# Patient Record
Sex: Female | Born: 1965 | Race: White | Hispanic: No | Marital: Married
Health system: Southern US, Community
[De-identification: ages and names within clinical notes are randomized; demographics above are authoritative.]

## PROBLEM LIST (undated history)

## (undated) DIAGNOSIS — E785 Hyperlipidemia, unspecified: Secondary | ICD-10-CM

## (undated) HISTORY — DX: Hyperlipidemia, unspecified: E78.5

---

## 1998-01-28 ENCOUNTER — Other Ambulatory Visit: Admission: RE | Admit: 1998-01-28 | Discharge: 1998-01-28 | Payer: Self-pay | Admitting: *Deleted

## 1999-02-08 ENCOUNTER — Other Ambulatory Visit: Admission: RE | Admit: 1999-02-08 | Discharge: 1999-02-08 | Payer: Self-pay | Admitting: *Deleted

## 1999-04-11 ENCOUNTER — Inpatient Hospital Stay (HOSPITAL_COMMUNITY): Admission: AD | Admit: 1999-04-11 | Discharge: 1999-04-11 | Payer: Self-pay | Admitting: Obstetrics and Gynecology

## 1999-04-21 ENCOUNTER — Encounter: Payer: Self-pay | Admitting: Obstetrics and Gynecology

## 1999-04-22 ENCOUNTER — Encounter (INDEPENDENT_AMBULATORY_CARE_PROVIDER_SITE_OTHER): Payer: Self-pay

## 1999-04-22 ENCOUNTER — Ambulatory Visit (HOSPITAL_COMMUNITY): Admission: AD | Admit: 1999-04-22 | Discharge: 1999-04-22 | Payer: Self-pay | Admitting: *Deleted

## 1999-10-05 ENCOUNTER — Other Ambulatory Visit: Admission: RE | Admit: 1999-10-05 | Discharge: 1999-10-05 | Payer: Self-pay | Admitting: Obstetrics and Gynecology

## 2000-02-08 ENCOUNTER — Inpatient Hospital Stay (HOSPITAL_COMMUNITY): Admission: AD | Admit: 2000-02-08 | Discharge: 2000-02-08 | Payer: Self-pay | Admitting: Obstetrics and Gynecology

## 2000-05-03 ENCOUNTER — Inpatient Hospital Stay (HOSPITAL_COMMUNITY): Admission: AD | Admit: 2000-05-03 | Discharge: 2000-05-05 | Payer: Self-pay | Admitting: Obstetrics and Gynecology

## 2001-02-14 ENCOUNTER — Other Ambulatory Visit: Admission: RE | Admit: 2001-02-14 | Discharge: 2001-02-14 | Payer: Self-pay | Admitting: *Deleted

## 2001-07-10 ENCOUNTER — Other Ambulatory Visit: Admission: RE | Admit: 2001-07-10 | Discharge: 2001-07-10 | Payer: Self-pay | Admitting: Obstetrics and Gynecology

## 2001-08-29 ENCOUNTER — Encounter: Payer: Self-pay | Admitting: Obstetrics and Gynecology

## 2001-08-29 ENCOUNTER — Ambulatory Visit (HOSPITAL_COMMUNITY): Admission: RE | Admit: 2001-08-29 | Discharge: 2001-08-29 | Payer: Self-pay | Admitting: Obstetrics and Gynecology

## 2001-11-15 ENCOUNTER — Inpatient Hospital Stay (HOSPITAL_COMMUNITY): Admission: AD | Admit: 2001-11-15 | Discharge: 2001-11-15 | Payer: Self-pay | Admitting: Obstetrics and Gynecology

## 2002-02-05 ENCOUNTER — Inpatient Hospital Stay (HOSPITAL_COMMUNITY): Admission: AD | Admit: 2002-02-05 | Discharge: 2002-02-07 | Payer: Self-pay | Admitting: Obstetrics and Gynecology

## 2003-01-02 ENCOUNTER — Other Ambulatory Visit: Admission: RE | Admit: 2003-01-02 | Discharge: 2003-01-02 | Payer: Self-pay | Admitting: *Deleted

## 2004-01-05 ENCOUNTER — Other Ambulatory Visit: Admission: RE | Admit: 2004-01-05 | Discharge: 2004-01-05 | Payer: Self-pay | Admitting: *Deleted

## 2004-11-02 ENCOUNTER — Encounter: Admission: RE | Admit: 2004-11-02 | Discharge: 2004-11-02 | Payer: Self-pay | Admitting: *Deleted

## 2005-04-12 ENCOUNTER — Other Ambulatory Visit: Admission: RE | Admit: 2005-04-12 | Discharge: 2005-04-12 | Payer: Self-pay | Admitting: *Deleted

## 2006-06-08 ENCOUNTER — Other Ambulatory Visit: Admission: RE | Admit: 2006-06-08 | Discharge: 2006-06-08 | Payer: Self-pay | Admitting: *Deleted

## 2006-06-12 ENCOUNTER — Encounter: Admission: RE | Admit: 2006-06-12 | Discharge: 2006-06-12 | Payer: Self-pay | Admitting: *Deleted

## 2007-05-30 ENCOUNTER — Ambulatory Visit: Payer: Self-pay | Admitting: Family Medicine

## 2007-06-13 ENCOUNTER — Other Ambulatory Visit: Admission: RE | Admit: 2007-06-13 | Discharge: 2007-06-13 | Payer: Self-pay | Admitting: *Deleted

## 2007-06-15 ENCOUNTER — Encounter: Admission: RE | Admit: 2007-06-15 | Discharge: 2007-06-15 | Payer: Self-pay | Admitting: *Deleted

## 2009-04-23 ENCOUNTER — Encounter: Admission: RE | Admit: 2009-04-23 | Discharge: 2009-04-23 | Payer: Self-pay | Admitting: Obstetrics and Gynecology

## 2010-08-19 ENCOUNTER — Encounter: Admission: RE | Admit: 2010-08-19 | Discharge: 2010-08-19 | Payer: Self-pay | Admitting: Obstetrics and Gynecology
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
## 2011-02-18 NOTE — H&P (Signed)
[facility]  Patient:    [name], [name] Visit Number: [id number] MRN: [id number]          Service Type: OBS Location: 910A [id number] Attending Physician:  Leonard Schwartz Dictated by:   Saverio Danker, C.N.M. Admit Date:  02/05/2002                           History and Physical  HISTORY OF PRESENT ILLNESS:   [name] is a 45 year old married white female, gravida 4, para 1-0-2-1, at 41-2/7 weeks by LMP, confirmed by ultrasound, who presents complaining of leaking clear fluid at 3 a.m. and the onset of uterine contractions every four to five minutes since that time.  She reports that she was 4 cm yesterday and was actually scheduled for induction this morning for being post dates.  She denies any nausea, vomiting, headaches, or visual disturbances.  Her pregnancy has been followed at [facility] by the certified nurse midwife service and has been at risk for:  (1) Advanced maternal age; (2) declining amnio; (3) Rh negative; (4) Positive group B strep; (5) history of prolonged second stage with her previous delivery.  OB/GYN HISTORY:               She is a gravida 4, para 1-0-2-1 who had elective AB in 1996 with no complications, a miscarriage in 2000 requiring a D&E, and a viable female infant delivered in July 2001 who weighed 8 pounds 5 ounces at [redacted] weeks gestation.  She was delivered vaginally with an epidural for anesthesia.  His name is Sherilyn Cooter.  She is Rh negative and did receive RhoGAM with that pregnancy.  She was also group B strep positive with that pregnancy as well as this one.  PAST MEDICAL HISTORY:         She reports having had the usual childhood diseases.  She has no medical problems.  PAST SURGICAL HISTORY:        Her only surgery was wisdom teeth removed.  ALLERGIES:                    No known drug allergies.  FAMILY HISTORY:               Significant only for maternal grandfather with heart  disease, maternal grandmother with diabetes, and paternal grandfather with prostate cancer.  GENETIC HISTORY:              Negative with the exception that the patient is over age 45 at delivery and declined amnio.  SOCIAL HISTORY:               She is married to [facility], who is involved and supportive.  They are of the [place] faith.  They deny any illicit drug use, alcohol, or smoking with this pregnancy.  PRENATAL LABORATORY DATA:     Her blood type is O negative, her antibody screen was negative.  Syphilis is nonreactive.  Rubella is equivocal. Hepatitis B surface antigen is negative.  GC and Chlamydia are both negative. Pap is within normal limits.  Her one-hour Glucola is 107, and her maternal serum alpha-fetoprotein was within the normal range.  A 36-week beta strep was positive.  PHYSICAL EXAMINATION:  VITAL SIGNS:                  Stable.  She is afebrile.  HEENT:  Grossly within normal limits.  CHEST:                        Clear.  BREASTS:                      Soft and nontender.  CARDIAC:                      Her heart is regular rhythm and rate.  ABDOMEN:                      Gravid with uterine contractions every four to five minutes.  Her fetal heart rate is reactive and reassuring.  PELVIC:                       5-6 cm, 90%, vertex -1 to 0, with spontaneous rupture of membranes and clear fluid noted.  EXTREMITIES:                  Within normal limits.  ASSESSMENT:                   1. Intrauterine pregnancy at term.                               2. Spontaneous rupture of membranes at 3 a.m.                               3. Active labor.                               4. Positive group B Streptococcus.  PLAN:                         1. Admit to labor and delivery.                               2. Follow routine CNM orders.                               3. Will give her penicillin for group B strep                                   prophylaxis.                               4. Notify Dr. Janine Limbo of patients                                  admission.  NEUROLOGIC: Dictated by:   Vance Gather Duplantis, C.N.M. Attending Physician:  Leonard Schwartz DD:  02/05/02 TD:  02/05/02 Job: [id number]

## 2012-10-23 IMAGING — MG MM DIGITAL SCREENING
4 series · 4 of 4 positions shown · non-contrast
Comparison: Prior studies.

DG SCREEN MAMMOGRAM BILATERAL
Bilateral CC and MLO view(s) were taken.

DIGITAL SCREENING MAMMOGRAM WITH CAD:

[R CC]
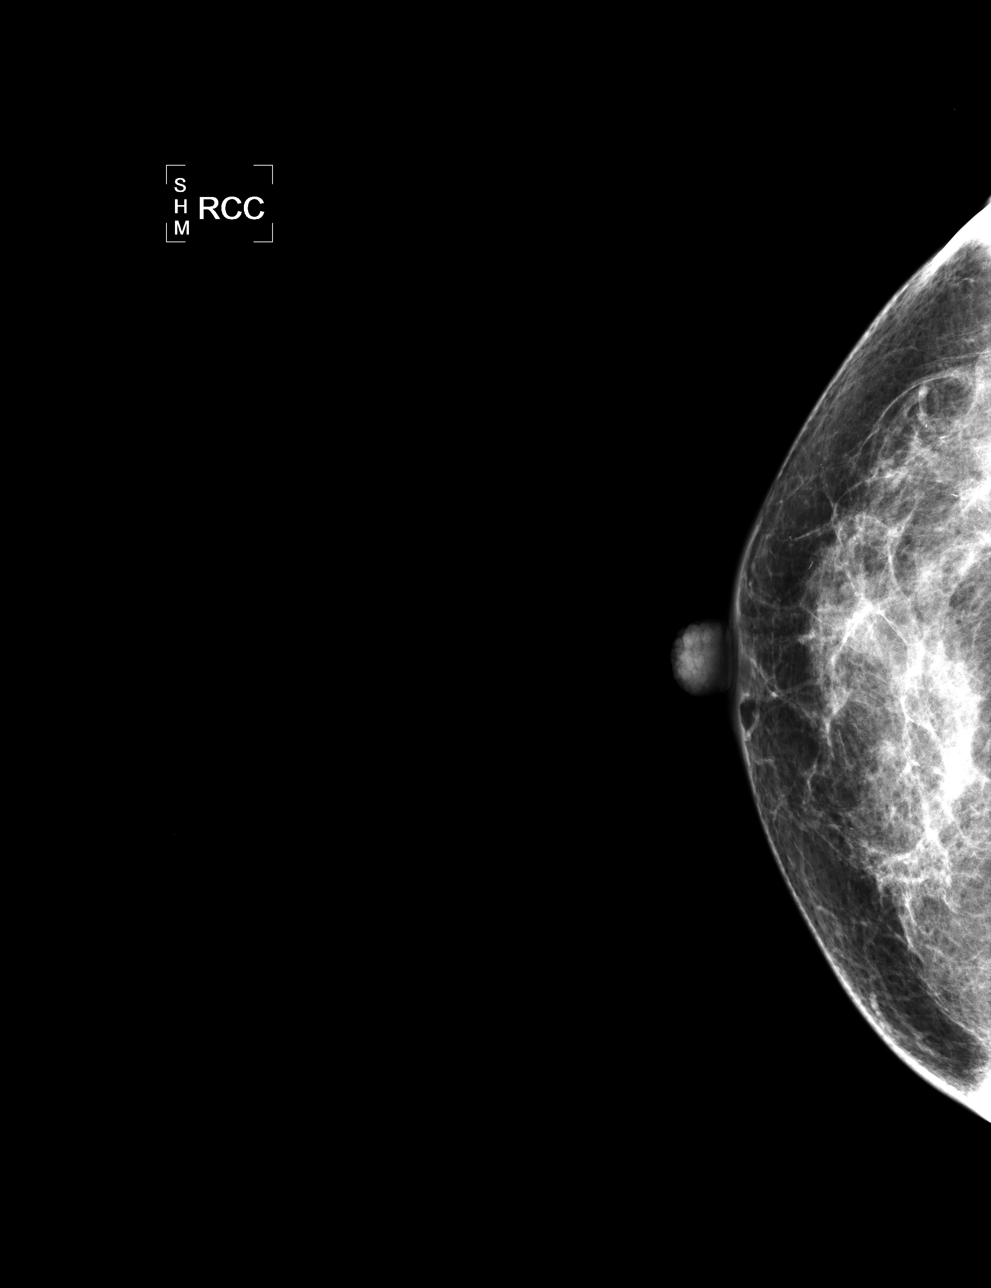

[L CC]
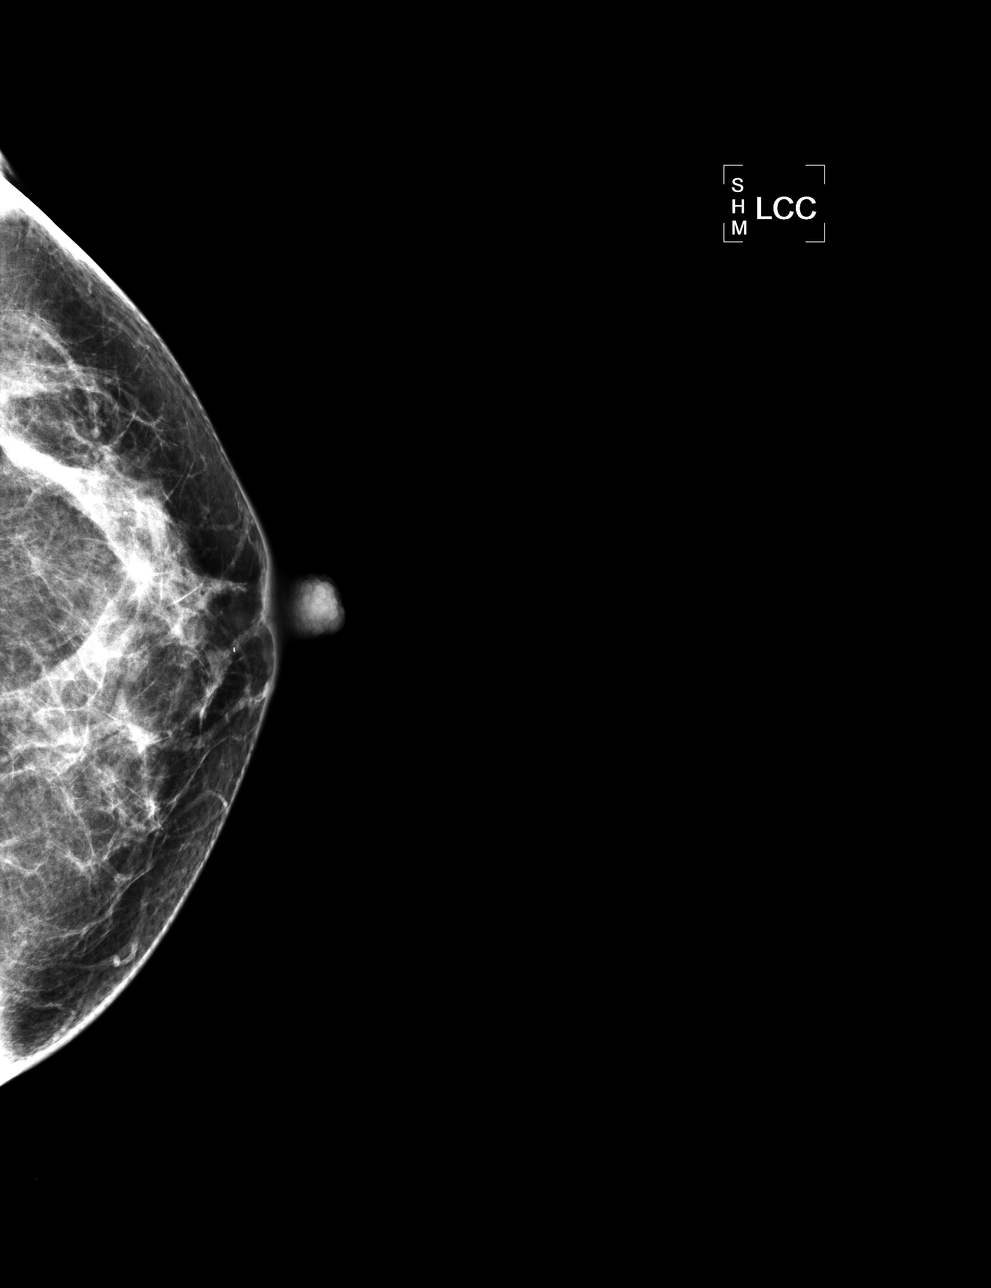

[L MLO]
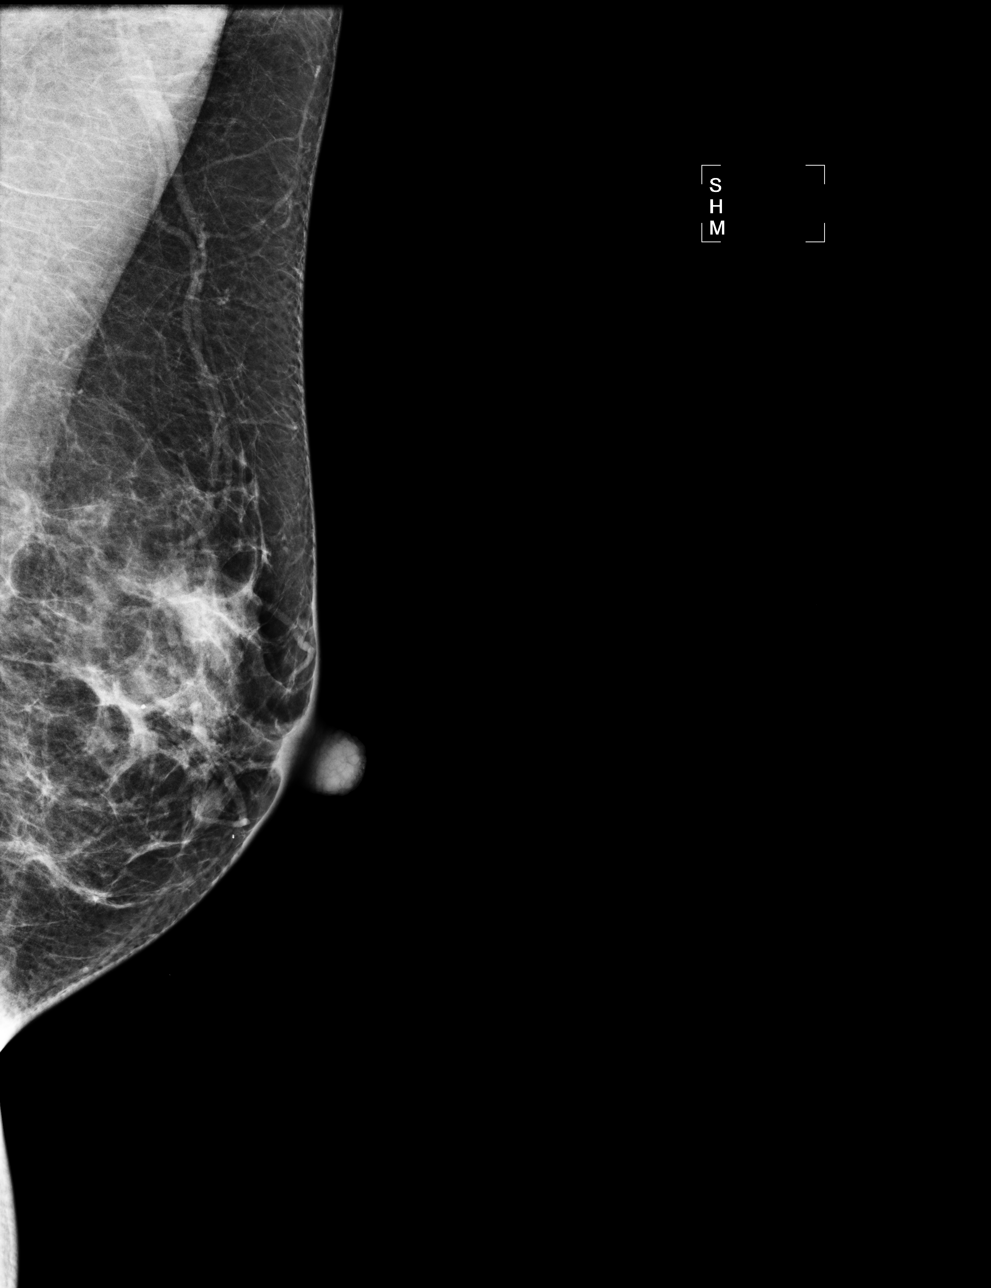

[R MLO]
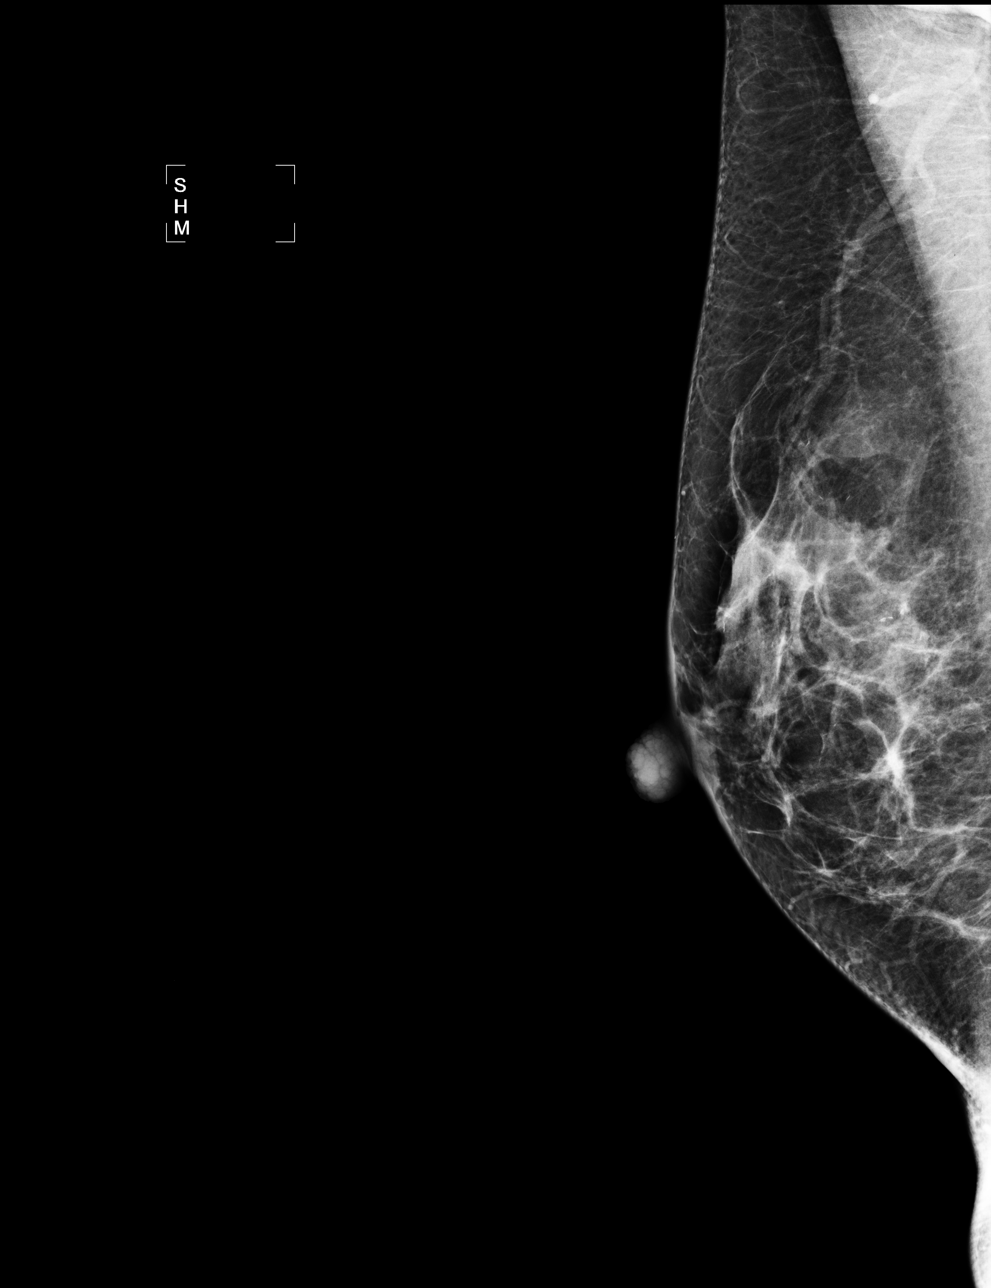

[4 of 4 positions shown; findings below may reference images not displayed]

There are scattered fibroglandular densities.  There is no dominant mass, architectural distortion 
or calcification to suggest malignancy.

Images were processed with CAD.
IMPRESSION: No mammographic evidence of malignancy.  Suggest yearly screening mammography.

A result letter of this screening mammogram will be mailed directly to the patient.

ASSESSMENT: Negative - BI-RADS 1

Screening mammogram in 1 year.
,

## 2016-06-01 DIAGNOSIS — R2231 Localized swelling, mass and lump, right upper limb: Secondary | ICD-10-CM | POA: Diagnosis not present

## 2016-06-30 DIAGNOSIS — E559 Vitamin D deficiency, unspecified: Secondary | ICD-10-CM | POA: Diagnosis not present

## 2016-06-30 DIAGNOSIS — Z0000 Encounter for general adult medical examination without abnormal findings: Secondary | ICD-10-CM | POA: Diagnosis not present

## 2016-06-30 DIAGNOSIS — E785 Hyperlipidemia, unspecified: Secondary | ICD-10-CM | POA: Diagnosis not present

## 2016-08-17 DIAGNOSIS — Z6820 Body mass index (BMI) 20.0-20.9, adult: Secondary | ICD-10-CM | POA: Diagnosis not present

## 2016-08-17 DIAGNOSIS — Z1231 Encounter for screening mammogram for malignant neoplasm of breast: Secondary | ICD-10-CM | POA: Diagnosis not present

## 2016-08-17 DIAGNOSIS — Z01419 Encounter for gynecological examination (general) (routine) without abnormal findings: Secondary | ICD-10-CM | POA: Diagnosis not present

## 2016-11-29 DIAGNOSIS — Z8371 Family history of colonic polyps: Secondary | ICD-10-CM | POA: Diagnosis not present

## 2016-11-29 DIAGNOSIS — Z1211 Encounter for screening for malignant neoplasm of colon: Secondary | ICD-10-CM | POA: Diagnosis not present

## 2016-11-29 DIAGNOSIS — D126 Benign neoplasm of colon, unspecified: Secondary | ICD-10-CM | POA: Diagnosis not present

## 2016-12-01 DIAGNOSIS — D126 Benign neoplasm of colon, unspecified: Secondary | ICD-10-CM | POA: Diagnosis not present

## 2016-12-27 DIAGNOSIS — E559 Vitamin D deficiency, unspecified: Secondary | ICD-10-CM | POA: Diagnosis not present

## 2016-12-27 DIAGNOSIS — E785 Hyperlipidemia, unspecified: Secondary | ICD-10-CM | POA: Diagnosis not present

## 2017-03-29 DIAGNOSIS — M19011 Primary osteoarthritis, right shoulder: Secondary | ICD-10-CM | POA: Diagnosis not present

## 2017-03-29 DIAGNOSIS — G8929 Other chronic pain: Secondary | ICD-10-CM | POA: Diagnosis not present

## 2017-03-29 DIAGNOSIS — M25511 Pain in right shoulder: Secondary | ICD-10-CM | POA: Diagnosis not present

## 2017-03-29 DIAGNOSIS — M7541 Impingement syndrome of right shoulder: Secondary | ICD-10-CM | POA: Diagnosis not present

## 2017-03-30 DIAGNOSIS — M7541 Impingement syndrome of right shoulder: Secondary | ICD-10-CM | POA: Diagnosis not present

## 2017-03-30 DIAGNOSIS — G8929 Other chronic pain: Secondary | ICD-10-CM | POA: Diagnosis not present

## 2017-04-19 DIAGNOSIS — M25511 Pain in right shoulder: Secondary | ICD-10-CM | POA: Diagnosis not present

## 2017-04-26 DIAGNOSIS — M25511 Pain in right shoulder: Secondary | ICD-10-CM | POA: Diagnosis not present

## 2017-05-05 DIAGNOSIS — M25511 Pain in right shoulder: Secondary | ICD-10-CM | POA: Diagnosis not present

## 2017-05-13 DIAGNOSIS — S8265XA Nondisplaced fracture of lateral malleolus of left fibula, initial encounter for closed fracture: Secondary | ICD-10-CM | POA: Diagnosis not present

## 2017-05-15 DIAGNOSIS — M25572 Pain in left ankle and joints of left foot: Secondary | ICD-10-CM | POA: Diagnosis not present

## 2017-05-16 DIAGNOSIS — M25572 Pain in left ankle and joints of left foot: Secondary | ICD-10-CM | POA: Diagnosis not present

## 2017-05-26 DIAGNOSIS — M25572 Pain in left ankle and joints of left foot: Secondary | ICD-10-CM | POA: Diagnosis not present

## 2017-05-26 DIAGNOSIS — S82832A Other fracture of upper and lower end of left fibula, initial encounter for closed fracture: Secondary | ICD-10-CM | POA: Diagnosis not present

## 2017-06-15 DIAGNOSIS — M25572 Pain in left ankle and joints of left foot: Secondary | ICD-10-CM | POA: Diagnosis not present

## 2017-06-15 DIAGNOSIS — S82832D Other fracture of upper and lower end of left fibula, subsequent encounter for closed fracture with routine healing: Secondary | ICD-10-CM | POA: Diagnosis not present

## 2017-06-27 DIAGNOSIS — M25511 Pain in right shoulder: Secondary | ICD-10-CM | POA: Diagnosis not present

## 2017-06-30 DIAGNOSIS — M25511 Pain in right shoulder: Secondary | ICD-10-CM | POA: Diagnosis not present

## 2017-07-07 DIAGNOSIS — S82492E Other fracture of shaft of left fibula, subsequent encounter for open fracture type I or II with routine healing: Secondary | ICD-10-CM | POA: Diagnosis not present

## 2017-07-07 DIAGNOSIS — M25511 Pain in right shoulder: Secondary | ICD-10-CM | POA: Diagnosis not present

## 2017-07-14 DIAGNOSIS — M25511 Pain in right shoulder: Secondary | ICD-10-CM | POA: Diagnosis not present

## 2017-07-14 DIAGNOSIS — S82492E Other fracture of shaft of left fibula, subsequent encounter for open fracture type I or II with routine healing: Secondary | ICD-10-CM | POA: Diagnosis not present

## 2017-07-31 DIAGNOSIS — M25511 Pain in right shoulder: Secondary | ICD-10-CM | POA: Diagnosis not present

## 2017-07-31 DIAGNOSIS — S82492E Other fracture of shaft of left fibula, subsequent encounter for open fracture type I or II with routine healing: Secondary | ICD-10-CM | POA: Diagnosis not present

## 2017-08-02 DIAGNOSIS — E785 Hyperlipidemia, unspecified: Secondary | ICD-10-CM | POA: Diagnosis not present

## 2017-08-02 DIAGNOSIS — Z0000 Encounter for general adult medical examination without abnormal findings: Secondary | ICD-10-CM | POA: Diagnosis not present

## 2017-08-02 DIAGNOSIS — E559 Vitamin D deficiency, unspecified: Secondary | ICD-10-CM | POA: Diagnosis not present

## 2017-08-09 DIAGNOSIS — M25511 Pain in right shoulder: Secondary | ICD-10-CM | POA: Diagnosis not present

## 2017-08-09 DIAGNOSIS — S82492E Other fracture of shaft of left fibula, subsequent encounter for open fracture type I or II with routine healing: Secondary | ICD-10-CM | POA: Diagnosis not present

## 2017-08-15 DIAGNOSIS — M25511 Pain in right shoulder: Secondary | ICD-10-CM | POA: Diagnosis not present

## 2017-08-15 DIAGNOSIS — S82492E Other fracture of shaft of left fibula, subsequent encounter for open fracture type I or II with routine healing: Secondary | ICD-10-CM | POA: Diagnosis not present

## 2017-08-30 DIAGNOSIS — S82492E Other fracture of shaft of left fibula, subsequent encounter for open fracture type I or II with routine healing: Secondary | ICD-10-CM | POA: Diagnosis not present

## 2017-08-30 DIAGNOSIS — M25511 Pain in right shoulder: Secondary | ICD-10-CM | POA: Diagnosis not present

## 2017-10-05 DIAGNOSIS — Z01419 Encounter for gynecological examination (general) (routine) without abnormal findings: Secondary | ICD-10-CM | POA: Diagnosis not present

## 2017-10-05 DIAGNOSIS — Z1231 Encounter for screening mammogram for malignant neoplasm of breast: Secondary | ICD-10-CM | POA: Diagnosis not present

## 2017-10-05 DIAGNOSIS — Z6822 Body mass index (BMI) 22.0-22.9, adult: Secondary | ICD-10-CM | POA: Diagnosis not present

## 2017-11-03 DIAGNOSIS — E7800 Pure hypercholesterolemia, unspecified: Secondary | ICD-10-CM | POA: Diagnosis not present

## 2017-12-07 DIAGNOSIS — S82492E Other fracture of shaft of left fibula, subsequent encounter for open fracture type I or II with routine healing: Secondary | ICD-10-CM | POA: Diagnosis not present

## 2017-12-07 DIAGNOSIS — M25511 Pain in right shoulder: Secondary | ICD-10-CM | POA: Diagnosis not present

## 2017-12-21 DIAGNOSIS — N950 Postmenopausal bleeding: Secondary | ICD-10-CM | POA: Diagnosis not present

## 2017-12-28 DIAGNOSIS — N950 Postmenopausal bleeding: Secondary | ICD-10-CM | POA: Diagnosis not present

## 2018-01-05 DIAGNOSIS — M25511 Pain in right shoulder: Secondary | ICD-10-CM | POA: Diagnosis not present

## 2018-01-05 DIAGNOSIS — S82492E Other fracture of shaft of left fibula, subsequent encounter for open fracture type I or II with routine healing: Secondary | ICD-10-CM | POA: Diagnosis not present

## 2018-01-25 DIAGNOSIS — M25511 Pain in right shoulder: Secondary | ICD-10-CM | POA: Diagnosis not present

## 2018-01-25 DIAGNOSIS — S82492E Other fracture of shaft of left fibula, subsequent encounter for open fracture type I or II with routine healing: Secondary | ICD-10-CM | POA: Diagnosis not present

## 2018-02-27 DIAGNOSIS — M25511 Pain in right shoulder: Secondary | ICD-10-CM | POA: Diagnosis not present

## 2018-02-27 DIAGNOSIS — S82492E Other fracture of shaft of left fibula, subsequent encounter for open fracture type I or II with routine healing: Secondary | ICD-10-CM | POA: Diagnosis not present

## 2018-02-28 DIAGNOSIS — I788 Other diseases of capillaries: Secondary | ICD-10-CM | POA: Diagnosis not present

## 2018-02-28 DIAGNOSIS — L814 Other melanin hyperpigmentation: Secondary | ICD-10-CM | POA: Diagnosis not present

## 2018-02-28 DIAGNOSIS — B351 Tinea unguium: Secondary | ICD-10-CM | POA: Diagnosis not present

## 2018-04-04 DIAGNOSIS — M25511 Pain in right shoulder: Secondary | ICD-10-CM | POA: Diagnosis not present

## 2018-04-04 DIAGNOSIS — S82492E Other fracture of shaft of left fibula, subsequent encounter for open fracture type I or II with routine healing: Secondary | ICD-10-CM | POA: Diagnosis not present

## 2018-04-13 DIAGNOSIS — M25511 Pain in right shoulder: Secondary | ICD-10-CM | POA: Diagnosis not present

## 2018-04-13 DIAGNOSIS — S82492E Other fracture of shaft of left fibula, subsequent encounter for open fracture type I or II with routine healing: Secondary | ICD-10-CM | POA: Diagnosis not present

## 2018-05-17 DIAGNOSIS — S82492E Other fracture of shaft of left fibula, subsequent encounter for open fracture type I or II with routine healing: Secondary | ICD-10-CM | POA: Diagnosis not present

## 2018-05-17 DIAGNOSIS — M25511 Pain in right shoulder: Secondary | ICD-10-CM | POA: Diagnosis not present

## 2018-05-25 DIAGNOSIS — S82492E Other fracture of shaft of left fibula, subsequent encounter for open fracture type I or II with routine healing: Secondary | ICD-10-CM | POA: Diagnosis not present

## 2018-05-25 DIAGNOSIS — M25511 Pain in right shoulder: Secondary | ICD-10-CM | POA: Diagnosis not present

## 2018-06-14 DIAGNOSIS — S82492E Other fracture of shaft of left fibula, subsequent encounter for open fracture type I or II with routine healing: Secondary | ICD-10-CM | POA: Diagnosis not present

## 2018-06-14 DIAGNOSIS — M25511 Pain in right shoulder: Secondary | ICD-10-CM | POA: Diagnosis not present

## 2018-06-21 DIAGNOSIS — S82492E Other fracture of shaft of left fibula, subsequent encounter for open fracture type I or II with routine healing: Secondary | ICD-10-CM | POA: Diagnosis not present

## 2018-06-21 DIAGNOSIS — M25511 Pain in right shoulder: Secondary | ICD-10-CM | POA: Diagnosis not present

## 2018-07-05 DIAGNOSIS — M25511 Pain in right shoulder: Secondary | ICD-10-CM | POA: Diagnosis not present

## 2018-07-05 DIAGNOSIS — S82492E Other fracture of shaft of left fibula, subsequent encounter for open fracture type I or II with routine healing: Secondary | ICD-10-CM | POA: Diagnosis not present

## 2018-08-29 DIAGNOSIS — E785 Hyperlipidemia, unspecified: Secondary | ICD-10-CM | POA: Diagnosis not present

## 2018-08-29 DIAGNOSIS — E559 Vitamin D deficiency, unspecified: Secondary | ICD-10-CM | POA: Diagnosis not present

## 2018-08-29 DIAGNOSIS — Z0000 Encounter for general adult medical examination without abnormal findings: Secondary | ICD-10-CM | POA: Diagnosis not present

## 2018-09-20 ENCOUNTER — Encounter: Payer: Self-pay | Attending: Family Medicine | Admitting: Registered"

## 2018-09-20 ENCOUNTER — Encounter: Payer: Self-pay | Admitting: Registered"

## 2018-09-20 DIAGNOSIS — E785 Hyperlipidemia, unspecified: Secondary | ICD-10-CM | POA: Insufficient documentation

## 2018-09-20 NOTE — Patient Instructions (Signed)
Instructions/Goals:    Make sure to get in three meals per day. Try to have balanced meals like the My Plate example (see handout). Include lean proteins, vegetables, fruits, and whole grains at meals.    Include heart healthy unsaturated fats and limit saturated fats. On nutrition label-limit items with 20% saturated fat and select those with around 5% saturated fat or less more often.    Include heart healthy fish including salmon, mackerel, albacore tuna, sardines, and herring.   Continue with calcium and vitamin D supplements.   Continue including at least 150 minutes of physical activity each week.   Water Goal: 64 oz per day.

## 2018-09-20 NOTE — Progress Notes (Signed)
Medical Nutrition Therapy:  Appt start time: 0807 end time:  0850.  Assessment:  Primary concerns today: Pt referred for hyperlipidemia. Pt reports that she has not been able to keep her cholesterol down consistently. Pt reports that she made many dietary changes (cut out most sweets and greatly reduced bread intake) and lost 10 lb. Pt reports she saw improvement in her cholesterol level previously following those changes. However, her last lab work on 08/29/18 showed elevated LDL (174) and NHDL (187). She reports that high cholesterol does run in her family (father and paternal grandmother). Pt reports that she knows how to eat for heart health and feels she is eating healthy. Pt reports she would like suggestions on changes she should make to her diet, foods to incorporate, etc and wants to know if she should keep a food journal. Pt reports several food dislikes and some texture sensitivities. Pt reports that she does not want to include any breads at breakfast and that she does not like oatmeal or most whole grain cereals. Pt does not like any type of dairy or eggs. Pt reports she can start back eating fish but only likes salmon. She reports she was having fish more often during the time she saw improvements in her cholesterol level. Pt reports that she feels she does not need to eat dinner because she does not get hungry but she always eats 3 meals per day out of habit. Pt reports that she has two kids and one of her children has many food preferences-some days will only eat brown foods per pt. Pt reports that she goes to the gym regularly.  Preferred Learning Style:   No preference indicated   Learning Readiness:   Ready  MEDICATIONS: See list.    DIETARY INTAKE:  Usual eating pattern includes 3 meals and sometimes 1 snack per day. Typical snack would include nuts. Meals are usually eaten at the dining room table or in den. Electronics may be present at mealtimes.   Everyday foods include  homemade chicken soup, apple. Pt reports that she eats homemade chicken soup most days for lunch.  Avoided foods include dairy (milk, cheese-other than on pizza, yogurt), eggs, oatmeal, foods with "mushy" texture.    24-hr recall:  B ( AM): 1 apple, green tea  Snk ( AM): None reported.  L ( PM): homemade chicken soup with beans, 1/4 avocado, water  Snk ( PM): None reported.  D ( PM): Mediterranean-1/4 pita bread, chicken kabob, salad with Mediterranean dressing, 1/8 cup hummus, no beverage reported Snk ( PM): None reported.  Beverages: ~48 oz water daily; green tea; sometimes has water with small amount of lemonade and cranberry juice   Usual physical activity: Works out 4-5 days per [employer]-strength training 2 days per week, cardio 2-3 days per week for 1 hour each time.   Estimated energy needs: 1800-2100 calories 207-339 g carbohydrates 41 g protein 41-81 g fat  Progress Towards Goal(s):  In progress.   Nutritional Diagnosis:  NB-1.1 Food and nutrition-related knowledge deficit As related to label reading for heart health .  As evidenced by pt has questions regarding what to look for on label for heart health.    Intervention:  Nutrition counseling provided. Dietitian discussed pt's lab values. Dietitian provided information regarding nutrition recommendations to promote heart health and importance of consistent meals. Dietitian provided information regarding how to select foods low in saturated fat using the food label. Dietitian discussed role of diet, physical activity as well as  genetics on cholesterol levels. Discussed how even if there is a genetic tendency for an individual to have elevated cholesterol, maintaining healthy lifestyle habits is important in helping to promote good numbers and overall heart health. Pt reports currently including many heart healthy foods and regular physical activity.   Discussed alternative breakfast ideas with pt. Pt reports she does not want to  include a whole grain at breakfast. Pt reports she wants to keep current breakfast of an apple and nut butter. Dietitian discussed alterative dairy beverages-pt reports she does not like any type of dairy or dairy alternative milks. Pt does like chocolate milk. Dietitian recommended pt continue with calcium and vitamin D supplements to supplement low dairy intake. Encouraged pt to include a protein at breakfast consistently as pt reports sometimes having fruit alone for breakfast. Recommended pt include fish high in omega 3 fatty acids 2-3 times per week to promote heart health.  Pt asked dietitian if she needs to be on cholesterol medication given her lab values. Dietitian responded that question is not within the scope of a dietitian, but a question for the MD. Dietitian let pt know that there are some people that need medication in addition to healthy lifestyle habits in order to control cholesterol but dietitian could not speak to pt's medication needs. Pt had questions regarding how often she should have cholesterol checked. Recommended pt discuss questions regarding her cholesterol and how often she should have it check with her doctor.   Teaching Method Utilized:  Visual Auditory  Handouts given during visit include:  Balanced plate with food list.   Heart Healthy Nutrition   Barriers to learning/adherence to lifestyle change: Pt has multiple food preferences.   Demonstrated degree of understanding via:  Teach Back   Monitoring/Evaluation:  Dietary intake, exercise, and body weight prn. Contact information given.

## 2018-11-04 ENCOUNTER — Encounter (HOSPITAL_COMMUNITY): Payer: Self-pay

## 2018-11-04 ENCOUNTER — Other Ambulatory Visit: Payer: Self-pay

## 2018-11-04 ENCOUNTER — Emergency Department (HOSPITAL_COMMUNITY)
Admission: EM | Admit: 2018-11-04 | Discharge: 2018-11-04 | Disposition: A | Discharge status: Home / Self Care | Payer: BLUE CROSS/BLUE SHIELD | Attending: Emergency Medicine | Admitting: Emergency Medicine

## 2018-11-04 ENCOUNTER — Emergency Department (HOSPITAL_COMMUNITY): Payer: BLUE CROSS/BLUE SHIELD

## 2018-11-04 DIAGNOSIS — S29012A Strain of muscle and tendon of back wall of thorax, initial encounter: Secondary | ICD-10-CM | POA: Diagnosis not present

## 2018-11-04 DIAGNOSIS — Z79899 Other long term (current) drug therapy: Secondary | ICD-10-CM | POA: Diagnosis not present

## 2018-11-04 DIAGNOSIS — R079 Chest pain, unspecified: Secondary | ICD-10-CM | POA: Insufficient documentation

## 2018-11-04 DIAGNOSIS — R0781 Pleurodynia: Secondary | ICD-10-CM | POA: Diagnosis present

## 2018-11-04 DIAGNOSIS — T148XXA Other injury of unspecified body region, initial encounter: Secondary | ICD-10-CM

## 2018-11-04 DIAGNOSIS — R0602 Shortness of breath: Secondary | ICD-10-CM | POA: Diagnosis not present

## 2018-11-04 LAB — D-DIMER, QUANTITATIVE: D-Dimer, Quant: <0.27 ug/mL-FEU (ref 0.00–0.50)

## 2018-11-04 LAB — BASIC METABOLIC PANEL
Anion gap: 8 (ref 5–15)
BUN: 15 mg/dL (ref 6–20)
CO2: 25 mmol/L (ref 22–32)
Calcium: 9.2 mg/dL (ref 8.9–10.3)
Chloride: 107 mmol/L (ref 98–111)
Creatinine, Ser: 0.56 mg/dL (ref 0.44–1.00)
GFR calc Af Amer: >60 mL/min (ref >60)
GFR calc non Af Amer: >60 mL/min (ref >60)
Glucose, Bld: 99 mg/dL (ref 70–99)
Potassium: 3.6 mmol/L (ref 3.5–5.1)
Sodium: 140 mmol/L (ref 135–145)

## 2018-11-04 LAB — CBC WITH DIFFERENTIAL/PLATELET
Abs Immature Granulocytes: 0.02 10*3/uL (ref 0.00–0.07)
Basophils Absolute: 0 10*3/uL (ref 0.0–0.1)
Basophils Relative: 0 %
Eosinophils Absolute: 0.2 10*3/uL (ref 0.0–0.5)
Eosinophils Relative: 2 %
HCT: 36.8 % (ref 36.0–46.0)
Hemoglobin: 13.2 g/dL (ref 12.0–15.0)
Immature Granulocytes: 0 %
Lymphocytes Relative: 47 %
Lymphs Abs: 3 10*3/uL (ref 0.7–4.0)
MCH: 30.7 pg (ref 26.0–34.0)
MCHC: 35.9 g/dL (ref 30.0–36.0)
MCV: 85.6 fL (ref 80.0–100.0)
Monocytes Absolute: 0.5 10*3/uL (ref 0.1–1.0)
Monocytes Relative: 8 %
Neutro Abs: 2.8 10*3/uL (ref 1.7–7.7)
Neutrophils Relative %: 43 %
Platelets: 198 10*3/uL (ref 150–400)
RBC: 4.3 MIL/uL (ref 3.87–5.11)
RDW: 12.6 % (ref 11.5–15.5)
WBC: 6.5 10*3/uL (ref 4.0–10.5)
nRBC: 0 % (ref 0.0–0.2)

## 2018-11-04 LAB — URINALYSIS, ROUTINE W REFLEX MICROSCOPIC
Bacteria, UA: NONE SEEN
Bilirubin Urine: NEGATIVE
Glucose, UA: NEGATIVE mg/dL
Hgb urine dipstick: NEGATIVE
Ketones, ur: 5 mg/dL — AB
Nitrite: NEGATIVE
Protein, ur: NEGATIVE mg/dL
Specific Gravity, Urine: 1.029 (ref 1.005–1.030)
pH: 5 (ref 5.0–8.0)

## 2018-11-04 LAB — POC URINE PREG, ED: Preg Test, Ur: NEGATIVE

## 2018-11-04 MED ORDER — IBUPROFEN 400 MG PO TABS: 400.0000 mg | ORAL_TABLET | Freq: Four times a day (QID) | ORAL | 0 refills | Status: AC | PRN

## 2018-11-04 MED ORDER — METHOCARBAMOL 500 MG PO TABS: 500.0000 mg | ORAL_TABLET | Freq: Two times a day (BID) | ORAL | 0 refills | Status: AC

## 2018-11-04 NOTE — ED Provider Notes (Signed)
[facility]-EMERGENCY DEPT Provider Note   CSN: [id number] Arrival date & time: 11/04/18  2046     History   Chief Complaint Chief Complaint  Patient presents with  . Flank Pain    L    HPI [name] is a 53 y.o. female.  53 year old female presents with 2 days of intermittent colicky left posterior rib pain.  Patient denies any rashes.  States that she has had no dyspnea.  Pain waxes and wanes and is worse with deep breathing.  Denies any anginal type chest pressure.  Has had no diaphoresis.  No urinary symptoms.  Denies any recent history of trauma.  No prior history of same.  Has used ibuprofen with temporary relief.     Past Medical History:  Diagnosis Date  . Hyperlipidemia     There are no active problems to display for this patient.   History reviewed. No pertinent surgical history.   OB History   No obstetric history on file.      Home Medications    Prior to Admission medications   Medication Sig Start Date End Date Taking? Authorizing Provider  Calcium-Magnesium-Vitamin D (CALCIUM MAGNESIUM PO) Take by mouth.    [provider]  Red Yeast Rice Extract (RED YEAST RICE PO) Take by mouth.    [provider]  VITAMIN D PO Take by mouth.    [provider]    Family History Family History  Problem Relation Age of Onset  . Hyperlipidemia Father   . Hyperlipidemia Paternal Grandmother     Social History Social History   Tobacco Use  . Smoking status: Not on file  Substance Use Topics  . Alcohol use: Not on file  . Drug use: Not on file     Allergies   Patient has no known allergies.   Review of Systems Review of Systems  All other systems reviewed and are negative.    Physical Exam Updated Vital Signs BP 109/73 (BP Location: Left Arm)   Pulse 67   Temp (!) 97.5 F (36.4 C) (Oral)   Resp 16   SpO2 100%   Physical Exam Vitals signs and nursing note reviewed.    Constitutional:      General: She is not in acute distress.    Appearance: Normal appearance. She is well-developed. She is not toxic-appearing.  HENT:     Head: Normocephalic and atraumatic.  Eyes:     General: Lids are normal.     Conjunctiva/sclera: Conjunctivae normal.     Pupils: Pupils are equal, round, and reactive to light.  Neck:     Musculoskeletal: Normal range of motion and neck supple.     Thyroid: No thyroid mass.     Trachea: No tracheal deviation.  Cardiovascular:     Rate and Rhythm: Normal rate and regular rhythm.     Heart sounds: Normal heart sounds. No murmur. No gallop.   Pulmonary:     Effort: Pulmonary effort is normal. No respiratory distress.     Breath sounds: Normal breath sounds. No stridor. No decreased breath sounds, wheezing, rhonchi or rales.  Abdominal:     General: Bowel sounds are normal. There is no distension.     Palpations: Abdomen is soft.     Tenderness: There is no abdominal tenderness. There is no rebound.  Musculoskeletal: Normal range of motion.        General: No tenderness.       Back:  Skin:  General: Skin is warm and dry.     Findings: No abrasion or rash.  Neurological:     Mental Status: She is alert and oriented to person, place, and time.     GCS: GCS eye subscore is 4. GCS verbal subscore is 5. GCS motor subscore is 6.     Cranial Nerves: No cranial nerve deficit.     Sensory: No sensory deficit.  Psychiatric:        Speech: Speech normal.        Behavior: Behavior normal.      ED Treatments / Results  Labs (all labs ordered are listed, but only abnormal results are displayed) Labs Reviewed  URINALYSIS, ROUTINE W REFLEX MICROSCOPIC  D-DIMER, QUANTITATIVE (NOT AT [facility])  BASIC METABOLIC PANEL  POC URINE PREG, ED    EKG None  Radiology No results found.  Procedures Procedures (including critical care time)  Medications Ordered in ED Medications - No data to display   Initial Impression /  Assessment and Plan / ED Course  I have reviewed the triage vital signs and the nursing notes.  Pertinent labs & imaging results that were available during my care of the patient were reviewed by me and considered in my medical decision making (see chart for details).     Patient's work appears reassuring.  She has negative d-dimer.  Urinalysis negative.  Chest x-ray is also negative.  Suspect musculoskeletal strain and patient stable for discharge  Final Clinical Impressions(s) / ED Diagnoses   Final diagnoses:  Chest pain    ED Discharge Orders    None       Lorre NickAllen, Kameo Bains, MD 11/04/18 2245

## 2018-11-04 NOTE — ED Triage Notes (Signed)
Pt reports L flank pain starting last night. Denies N/V/D. No fever. Endorses some intermittent dizziness. A&Ox4.

## 2018-12-24 DIAGNOSIS — Z01419 Encounter for gynecological examination (general) (routine) without abnormal findings: Secondary | ICD-10-CM | POA: Diagnosis not present

## 2018-12-24 DIAGNOSIS — Z6820 Body mass index (BMI) 20.0-20.9, adult: Secondary | ICD-10-CM | POA: Diagnosis not present

## 2018-12-24 DIAGNOSIS — Z1231 Encounter for screening mammogram for malignant neoplasm of breast: Secondary | ICD-10-CM | POA: Diagnosis not present

## 2019-02-18 DIAGNOSIS — M79672 Pain in left foot: Secondary | ICD-10-CM | POA: Diagnosis not present

## 2019-02-18 DIAGNOSIS — S92352A Displaced fracture of fifth metatarsal bone, left foot, initial encounter for closed fracture: Secondary | ICD-10-CM | POA: Diagnosis not present

## 2019-02-26 DIAGNOSIS — H4911 Fourth [trochlear] nerve palsy, right eye: Secondary | ICD-10-CM | POA: Diagnosis not present

## 2019-03-22 DIAGNOSIS — S92354D Nondisplaced fracture of fifth metatarsal bone, right foot, subsequent encounter for fracture with routine healing: Secondary | ICD-10-CM | POA: Diagnosis not present

## 2019-03-22 DIAGNOSIS — M79672 Pain in left foot: Secondary | ICD-10-CM | POA: Diagnosis not present

## 2019-04-02 DIAGNOSIS — L237 Allergic contact dermatitis due to plants, except food: Secondary | ICD-10-CM | POA: Diagnosis not present

## 2019-04-02 DIAGNOSIS — L814 Other melanin hyperpigmentation: Secondary | ICD-10-CM | POA: Diagnosis not present

## 2019-04-02 DIAGNOSIS — B070 Plantar wart: Secondary | ICD-10-CM | POA: Diagnosis not present

## 2019-04-02 DIAGNOSIS — B351 Tinea unguium: Secondary | ICD-10-CM | POA: Diagnosis not present

## 2019-04-10 DIAGNOSIS — L237 Allergic contact dermatitis due to plants, except food: Secondary | ICD-10-CM | POA: Diagnosis not present

## 2019-04-10 DIAGNOSIS — B070 Plantar wart: Secondary | ICD-10-CM | POA: Diagnosis not present

## 2019-05-08 DIAGNOSIS — H4911 Fourth [trochlear] nerve palsy, right eye: Secondary | ICD-10-CM | POA: Diagnosis not present

## 2019-06-22 DIAGNOSIS — M545 Low back pain: Secondary | ICD-10-CM | POA: Diagnosis not present

## 2019-06-22 DIAGNOSIS — M25551 Pain in right hip: Secondary | ICD-10-CM | POA: Diagnosis not present

## 2019-06-28 DIAGNOSIS — M545 Low back pain: Secondary | ICD-10-CM | POA: Diagnosis not present

## 2019-07-04 DIAGNOSIS — Z23 Encounter for immunization: Secondary | ICD-10-CM | POA: Diagnosis not present

## 2019-07-04 DIAGNOSIS — Z0000 Encounter for general adult medical examination without abnormal findings: Secondary | ICD-10-CM | POA: Diagnosis not present

## 2019-07-04 DIAGNOSIS — Z8342 Family history of familial hypercholesterolemia: Secondary | ICD-10-CM | POA: Diagnosis not present

## 2019-07-04 DIAGNOSIS — E785 Hyperlipidemia, unspecified: Secondary | ICD-10-CM | POA: Diagnosis not present

## 2019-07-08 DIAGNOSIS — H814 Vertigo of central origin: Secondary | ICD-10-CM | POA: Diagnosis not present

## 2019-07-08 DIAGNOSIS — M9903 Segmental and somatic dysfunction of lumbar region: Secondary | ICD-10-CM | POA: Diagnosis not present

## 2019-07-08 DIAGNOSIS — M9905 Segmental and somatic dysfunction of pelvic region: Secondary | ICD-10-CM | POA: Diagnosis not present

## 2019-07-08 DIAGNOSIS — M5441 Lumbago with sciatica, right side: Secondary | ICD-10-CM | POA: Diagnosis not present

## 2019-07-11 DIAGNOSIS — M9905 Segmental and somatic dysfunction of pelvic region: Secondary | ICD-10-CM | POA: Diagnosis not present

## 2019-07-11 DIAGNOSIS — M9903 Segmental and somatic dysfunction of lumbar region: Secondary | ICD-10-CM | POA: Diagnosis not present

## 2019-07-11 DIAGNOSIS — M5441 Lumbago with sciatica, right side: Secondary | ICD-10-CM | POA: Diagnosis not present

## 2019-07-11 DIAGNOSIS — H814 Vertigo of central origin: Secondary | ICD-10-CM | POA: Diagnosis not present

## 2019-07-15 DIAGNOSIS — M9905 Segmental and somatic dysfunction of pelvic region: Secondary | ICD-10-CM | POA: Diagnosis not present

## 2019-07-15 DIAGNOSIS — H814 Vertigo of central origin: Secondary | ICD-10-CM | POA: Diagnosis not present

## 2019-07-15 DIAGNOSIS — M5441 Lumbago with sciatica, right side: Secondary | ICD-10-CM | POA: Diagnosis not present

## 2019-07-15 DIAGNOSIS — M9903 Segmental and somatic dysfunction of lumbar region: Secondary | ICD-10-CM | POA: Diagnosis not present

## 2019-07-22 DIAGNOSIS — B029 Zoster without complications: Secondary | ICD-10-CM | POA: Diagnosis not present

## 2019-07-22 DIAGNOSIS — H8113 Benign paroxysmal vertigo, bilateral: Secondary | ICD-10-CM | POA: Diagnosis not present

## 2019-07-22 DIAGNOSIS — E785 Hyperlipidemia, unspecified: Secondary | ICD-10-CM | POA: Diagnosis not present

## 2019-08-21 DIAGNOSIS — E785 Hyperlipidemia, unspecified: Secondary | ICD-10-CM | POA: Diagnosis not present

## 2019-10-07 DIAGNOSIS — H4911 Fourth [trochlear] nerve palsy, right eye: Secondary | ICD-10-CM | POA: Diagnosis not present

## 2019-10-16 DIAGNOSIS — H4911 Fourth [trochlear] nerve palsy, right eye: Secondary | ICD-10-CM | POA: Diagnosis not present

## 2019-12-26 DIAGNOSIS — Z01419 Encounter for gynecological examination (general) (routine) without abnormal findings: Secondary | ICD-10-CM | POA: Diagnosis not present

## 2019-12-26 DIAGNOSIS — Z6821 Body mass index (BMI) 21.0-21.9, adult: Secondary | ICD-10-CM | POA: Diagnosis not present

## 2019-12-26 DIAGNOSIS — Z1231 Encounter for screening mammogram for malignant neoplasm of breast: Secondary | ICD-10-CM | POA: Diagnosis not present

## 2020-02-18 DIAGNOSIS — E785 Hyperlipidemia, unspecified: Secondary | ICD-10-CM | POA: Diagnosis not present

## 2020-03-25 DIAGNOSIS — R2243 Localized swelling, mass and lump, lower limb, bilateral: Secondary | ICD-10-CM | POA: Diagnosis not present

## 2020-03-25 DIAGNOSIS — M2011 Hallux valgus (acquired), right foot: Secondary | ICD-10-CM | POA: Diagnosis not present

## 2020-03-25 DIAGNOSIS — S93145A Subluxation of metatarsophalangeal joint of left lesser toe(s), initial encounter: Secondary | ICD-10-CM | POA: Diagnosis not present

## 2020-03-25 DIAGNOSIS — M2012 Hallux valgus (acquired), left foot: Secondary | ICD-10-CM | POA: Diagnosis not present

## 2020-03-25 DIAGNOSIS — S93144A Subluxation of metatarsophalangeal joint of right lesser toe(s), initial encounter: Secondary | ICD-10-CM | POA: Diagnosis not present

## 2020-04-22 DIAGNOSIS — M19072 Primary osteoarthritis, left ankle and foot: Secondary | ICD-10-CM | POA: Diagnosis not present

## 2020-04-22 DIAGNOSIS — S93144A Subluxation of metatarsophalangeal joint of right lesser toe(s), initial encounter: Secondary | ICD-10-CM | POA: Diagnosis not present

## 2020-04-22 DIAGNOSIS — X58XXXA Exposure to other specified factors, initial encounter: Secondary | ICD-10-CM | POA: Diagnosis not present

## 2020-04-22 DIAGNOSIS — M19071 Primary osteoarthritis, right ankle and foot: Secondary | ICD-10-CM | POA: Diagnosis not present

## 2020-04-22 DIAGNOSIS — S93145A Subluxation of metatarsophalangeal joint of left lesser toe(s), initial encounter: Secondary | ICD-10-CM | POA: Diagnosis not present

## 2020-04-22 DIAGNOSIS — R600 Localized edema: Secondary | ICD-10-CM | POA: Diagnosis not present

## 2020-04-22 DIAGNOSIS — M2012 Hallux valgus (acquired), left foot: Secondary | ICD-10-CM | POA: Diagnosis not present

## 2020-04-22 DIAGNOSIS — M2011 Hallux valgus (acquired), right foot: Secondary | ICD-10-CM | POA: Diagnosis not present

## 2020-04-24 DIAGNOSIS — S93145D Subluxation of metatarsophalangeal joint of left lesser toe(s), subsequent encounter: Secondary | ICD-10-CM | POA: Diagnosis not present

## 2020-04-24 DIAGNOSIS — M2011 Hallux valgus (acquired), right foot: Secondary | ICD-10-CM | POA: Diagnosis not present

## 2020-04-24 DIAGNOSIS — M2012 Hallux valgus (acquired), left foot: Secondary | ICD-10-CM | POA: Diagnosis not present

## 2020-04-24 DIAGNOSIS — S93144D Subluxation of metatarsophalangeal joint of right lesser toe(s), subsequent encounter: Secondary | ICD-10-CM | POA: Diagnosis not present

## 2020-07-13 DIAGNOSIS — Z0000 Encounter for general adult medical examination without abnormal findings: Secondary | ICD-10-CM | POA: Diagnosis not present

## 2020-07-13 DIAGNOSIS — E785 Hyperlipidemia, unspecified: Secondary | ICD-10-CM | POA: Diagnosis not present

## 2020-08-05 DIAGNOSIS — G5763 Lesion of plantar nerve, bilateral lower limbs: Secondary | ICD-10-CM | POA: Diagnosis not present

## 2020-08-05 DIAGNOSIS — M2011 Hallux valgus (acquired), right foot: Secondary | ICD-10-CM | POA: Diagnosis not present

## 2020-08-05 DIAGNOSIS — R2243 Localized swelling, mass and lump, lower limb, bilateral: Secondary | ICD-10-CM | POA: Diagnosis not present

## 2020-08-05 DIAGNOSIS — S93145A Subluxation of metatarsophalangeal joint of left lesser toe(s), initial encounter: Secondary | ICD-10-CM | POA: Diagnosis not present

## 2020-08-20 DIAGNOSIS — M2011 Hallux valgus (acquired), right foot: Secondary | ICD-10-CM | POA: Diagnosis not present

## 2020-08-20 DIAGNOSIS — M216X1 Other acquired deformities of right foot: Secondary | ICD-10-CM | POA: Diagnosis not present

## 2020-08-20 DIAGNOSIS — G5761 Lesion of plantar nerve, right lower limb: Secondary | ICD-10-CM | POA: Diagnosis not present

## 2020-08-20 DIAGNOSIS — S93144A Subluxation of metatarsophalangeal joint of right lesser toe(s), initial encounter: Secondary | ICD-10-CM | POA: Diagnosis not present

## 2020-08-21 DIAGNOSIS — M2011 Hallux valgus (acquired), right foot: Secondary | ICD-10-CM | POA: Diagnosis not present

## 2020-08-21 DIAGNOSIS — R2243 Localized swelling, mass and lump, lower limb, bilateral: Secondary | ICD-10-CM | POA: Diagnosis not present

## 2020-08-21 DIAGNOSIS — S93145D Subluxation of metatarsophalangeal joint of left lesser toe(s), subsequent encounter: Secondary | ICD-10-CM | POA: Diagnosis not present

## 2020-08-21 DIAGNOSIS — M2012 Hallux valgus (acquired), left foot: Secondary | ICD-10-CM | POA: Diagnosis not present

## 2020-08-21 DIAGNOSIS — G5763 Lesion of plantar nerve, bilateral lower limbs: Secondary | ICD-10-CM | POA: Diagnosis not present

## 2020-09-17 DIAGNOSIS — M216X2 Other acquired deformities of left foot: Secondary | ICD-10-CM | POA: Diagnosis not present

## 2020-09-17 DIAGNOSIS — G5762 Lesion of plantar nerve, left lower limb: Secondary | ICD-10-CM | POA: Diagnosis not present

## 2020-09-17 DIAGNOSIS — M2012 Hallux valgus (acquired), left foot: Secondary | ICD-10-CM | POA: Diagnosis not present

## 2020-09-17 DIAGNOSIS — S93145A Subluxation of metatarsophalangeal joint of left lesser toe(s), initial encounter: Secondary | ICD-10-CM | POA: Diagnosis not present

## 2020-09-18 DIAGNOSIS — R2243 Localized swelling, mass and lump, lower limb, bilateral: Secondary | ICD-10-CM | POA: Diagnosis not present

## 2020-09-18 DIAGNOSIS — S93145D Subluxation of metatarsophalangeal joint of left lesser toe(s), subsequent encounter: Secondary | ICD-10-CM | POA: Diagnosis not present

## 2020-09-18 DIAGNOSIS — M2011 Hallux valgus (acquired), right foot: Secondary | ICD-10-CM | POA: Diagnosis not present

## 2020-09-18 DIAGNOSIS — G5763 Lesion of plantar nerve, bilateral lower limbs: Secondary | ICD-10-CM | POA: Diagnosis not present

## 2020-09-18 DIAGNOSIS — S93144D Subluxation of metatarsophalangeal joint of right lesser toe(s), subsequent encounter: Secondary | ICD-10-CM | POA: Diagnosis not present

## 2020-09-25 DIAGNOSIS — U071 COVID-19: Secondary | ICD-10-CM | POA: Diagnosis not present

## 2020-09-28 DIAGNOSIS — S93145D Subluxation of metatarsophalangeal joint of left lesser toe(s), subsequent encounter: Secondary | ICD-10-CM | POA: Diagnosis not present

## 2020-09-28 DIAGNOSIS — S93144A Subluxation of metatarsophalangeal joint of right lesser toe(s), initial encounter: Secondary | ICD-10-CM | POA: Diagnosis not present

## 2020-09-28 DIAGNOSIS — S93145A Subluxation of metatarsophalangeal joint of left lesser toe(s), initial encounter: Secondary | ICD-10-CM | POA: Diagnosis not present

## 2020-09-28 DIAGNOSIS — M2011 Hallux valgus (acquired), right foot: Secondary | ICD-10-CM | POA: Diagnosis not present

## 2020-10-05 DIAGNOSIS — S93144A Subluxation of metatarsophalangeal joint of right lesser toe(s), initial encounter: Secondary | ICD-10-CM | POA: Diagnosis not present

## 2020-10-05 DIAGNOSIS — S93144D Subluxation of metatarsophalangeal joint of right lesser toe(s), subsequent encounter: Secondary | ICD-10-CM | POA: Diagnosis not present

## 2020-10-05 DIAGNOSIS — S93145A Subluxation of metatarsophalangeal joint of left lesser toe(s), initial encounter: Secondary | ICD-10-CM | POA: Diagnosis not present

## 2020-10-05 DIAGNOSIS — S93145D Subluxation of metatarsophalangeal joint of left lesser toe(s), subsequent encounter: Secondary | ICD-10-CM | POA: Diagnosis not present

## 2020-10-05 DIAGNOSIS — G5763 Lesion of plantar nerve, bilateral lower limbs: Secondary | ICD-10-CM | POA: Diagnosis not present

## 2020-10-05 DIAGNOSIS — M2011 Hallux valgus (acquired), right foot: Secondary | ICD-10-CM | POA: Diagnosis not present

## 2020-10-14 DIAGNOSIS — S93145A Subluxation of metatarsophalangeal joint of left lesser toe(s), initial encounter: Secondary | ICD-10-CM | POA: Diagnosis not present

## 2020-10-14 DIAGNOSIS — M2011 Hallux valgus (acquired), right foot: Secondary | ICD-10-CM | POA: Diagnosis not present

## 2020-10-14 DIAGNOSIS — M2012 Hallux valgus (acquired), left foot: Secondary | ICD-10-CM | POA: Diagnosis not present

## 2020-10-14 DIAGNOSIS — S93144D Subluxation of metatarsophalangeal joint of right lesser toe(s), subsequent encounter: Secondary | ICD-10-CM | POA: Diagnosis not present

## 2020-10-14 DIAGNOSIS — S93145D Subluxation of metatarsophalangeal joint of left lesser toe(s), subsequent encounter: Secondary | ICD-10-CM | POA: Diagnosis not present

## 2020-10-14 DIAGNOSIS — G5763 Lesion of plantar nerve, bilateral lower limbs: Secondary | ICD-10-CM | POA: Diagnosis not present

## 2020-10-28 DIAGNOSIS — R2243 Localized swelling, mass and lump, lower limb, bilateral: Secondary | ICD-10-CM | POA: Diagnosis not present

## 2020-10-28 DIAGNOSIS — G5763 Lesion of plantar nerve, bilateral lower limbs: Secondary | ICD-10-CM | POA: Diagnosis not present

## 2020-10-28 DIAGNOSIS — M2011 Hallux valgus (acquired), right foot: Secondary | ICD-10-CM | POA: Diagnosis not present

## 2020-10-28 DIAGNOSIS — S93145A Subluxation of metatarsophalangeal joint of left lesser toe(s), initial encounter: Secondary | ICD-10-CM | POA: Diagnosis not present

## 2020-11-11 DIAGNOSIS — S93144D Subluxation of metatarsophalangeal joint of right lesser toe(s), subsequent encounter: Secondary | ICD-10-CM | POA: Diagnosis not present

## 2020-11-11 DIAGNOSIS — S93145D Subluxation of metatarsophalangeal joint of left lesser toe(s), subsequent encounter: Secondary | ICD-10-CM | POA: Diagnosis not present

## 2020-11-11 DIAGNOSIS — M2011 Hallux valgus (acquired), right foot: Secondary | ICD-10-CM | POA: Diagnosis not present

## 2020-11-11 DIAGNOSIS — G5763 Lesion of plantar nerve, bilateral lower limbs: Secondary | ICD-10-CM | POA: Diagnosis not present

## 2020-11-17 DIAGNOSIS — M79672 Pain in left foot: Secondary | ICD-10-CM | POA: Diagnosis not present

## 2020-11-17 DIAGNOSIS — R2689 Other abnormalities of gait and mobility: Secondary | ICD-10-CM | POA: Diagnosis not present

## 2020-11-17 DIAGNOSIS — M79671 Pain in right foot: Secondary | ICD-10-CM | POA: Diagnosis not present

## 2020-11-18 DIAGNOSIS — G5763 Lesion of plantar nerve, bilateral lower limbs: Secondary | ICD-10-CM | POA: Diagnosis not present

## 2020-11-18 DIAGNOSIS — S93145D Subluxation of metatarsophalangeal joint of left lesser toe(s), subsequent encounter: Secondary | ICD-10-CM | POA: Diagnosis not present

## 2020-11-18 DIAGNOSIS — M2011 Hallux valgus (acquired), right foot: Secondary | ICD-10-CM | POA: Diagnosis not present

## 2020-11-18 DIAGNOSIS — S93144D Subluxation of metatarsophalangeal joint of right lesser toe(s), subsequent encounter: Secondary | ICD-10-CM | POA: Diagnosis not present

## 2020-11-20 DIAGNOSIS — M79672 Pain in left foot: Secondary | ICD-10-CM | POA: Diagnosis not present

## 2020-11-20 DIAGNOSIS — M2011 Hallux valgus (acquired), right foot: Secondary | ICD-10-CM | POA: Diagnosis not present

## 2020-11-20 DIAGNOSIS — R2689 Other abnormalities of gait and mobility: Secondary | ICD-10-CM | POA: Diagnosis not present

## 2020-11-20 DIAGNOSIS — M79671 Pain in right foot: Secondary | ICD-10-CM | POA: Diagnosis not present

## 2020-11-24 DIAGNOSIS — M79672 Pain in left foot: Secondary | ICD-10-CM | POA: Diagnosis not present

## 2020-11-24 DIAGNOSIS — M2011 Hallux valgus (acquired), right foot: Secondary | ICD-10-CM | POA: Diagnosis not present

## 2020-11-24 DIAGNOSIS — R2689 Other abnormalities of gait and mobility: Secondary | ICD-10-CM | POA: Diagnosis not present

## 2020-11-24 DIAGNOSIS — M79671 Pain in right foot: Secondary | ICD-10-CM | POA: Diagnosis not present

## 2020-11-27 DIAGNOSIS — M79672 Pain in left foot: Secondary | ICD-10-CM | POA: Diagnosis not present

## 2020-11-27 DIAGNOSIS — M2011 Hallux valgus (acquired), right foot: Secondary | ICD-10-CM | POA: Diagnosis not present

## 2020-11-27 DIAGNOSIS — R2689 Other abnormalities of gait and mobility: Secondary | ICD-10-CM | POA: Diagnosis not present

## 2020-11-27 DIAGNOSIS — M79671 Pain in right foot: Secondary | ICD-10-CM | POA: Diagnosis not present

## 2020-12-01 DIAGNOSIS — R2689 Other abnormalities of gait and mobility: Secondary | ICD-10-CM | POA: Diagnosis not present

## 2020-12-01 DIAGNOSIS — M79672 Pain in left foot: Secondary | ICD-10-CM | POA: Diagnosis not present

## 2020-12-01 DIAGNOSIS — M2011 Hallux valgus (acquired), right foot: Secondary | ICD-10-CM | POA: Diagnosis not present

## 2020-12-01 DIAGNOSIS — M79671 Pain in right foot: Secondary | ICD-10-CM | POA: Diagnosis not present

## 2020-12-04 DIAGNOSIS — M79672 Pain in left foot: Secondary | ICD-10-CM | POA: Diagnosis not present

## 2020-12-04 DIAGNOSIS — M2011 Hallux valgus (acquired), right foot: Secondary | ICD-10-CM | POA: Diagnosis not present

## 2020-12-04 DIAGNOSIS — M79671 Pain in right foot: Secondary | ICD-10-CM | POA: Diagnosis not present

## 2020-12-04 DIAGNOSIS — R2689 Other abnormalities of gait and mobility: Secondary | ICD-10-CM | POA: Diagnosis not present

## 2020-12-08 DIAGNOSIS — R2689 Other abnormalities of gait and mobility: Secondary | ICD-10-CM | POA: Diagnosis not present

## 2020-12-08 DIAGNOSIS — M2011 Hallux valgus (acquired), right foot: Secondary | ICD-10-CM | POA: Diagnosis not present

## 2020-12-08 DIAGNOSIS — M79672 Pain in left foot: Secondary | ICD-10-CM | POA: Diagnosis not present

## 2020-12-08 DIAGNOSIS — M79671 Pain in right foot: Secondary | ICD-10-CM | POA: Diagnosis not present

## 2020-12-10 DIAGNOSIS — M79672 Pain in left foot: Secondary | ICD-10-CM | POA: Diagnosis not present

## 2020-12-10 DIAGNOSIS — M2011 Hallux valgus (acquired), right foot: Secondary | ICD-10-CM | POA: Diagnosis not present

## 2020-12-10 DIAGNOSIS — R2689 Other abnormalities of gait and mobility: Secondary | ICD-10-CM | POA: Diagnosis not present

## 2020-12-10 DIAGNOSIS — M79671 Pain in right foot: Secondary | ICD-10-CM | POA: Diagnosis not present

## 2020-12-17 DIAGNOSIS — M2011 Hallux valgus (acquired), right foot: Secondary | ICD-10-CM | POA: Diagnosis not present

## 2020-12-17 DIAGNOSIS — R2689 Other abnormalities of gait and mobility: Secondary | ICD-10-CM | POA: Diagnosis not present

## 2020-12-17 DIAGNOSIS — M79672 Pain in left foot: Secondary | ICD-10-CM | POA: Diagnosis not present

## 2020-12-17 DIAGNOSIS — M79671 Pain in right foot: Secondary | ICD-10-CM | POA: Diagnosis not present

## 2021-01-01 DIAGNOSIS — M25572 Pain in left ankle and joints of left foot: Secondary | ICD-10-CM | POA: Diagnosis not present

## 2021-01-01 DIAGNOSIS — M79672 Pain in left foot: Secondary | ICD-10-CM | POA: Diagnosis not present

## 2021-01-01 DIAGNOSIS — S93492A Sprain of other ligament of left ankle, initial encounter: Secondary | ICD-10-CM | POA: Diagnosis not present

## 2021-01-07 IMAGING — CR DG CHEST 2V
2 series · 2 of 2 positions shown · non-contrast
Comparison: None.

CLINICAL DATA: Left chest pain starting last night. Dizziness.
Shortness of breath. Nonsmoker.

EXAM:
CHEST - 2 VIEW

[w chest pa]
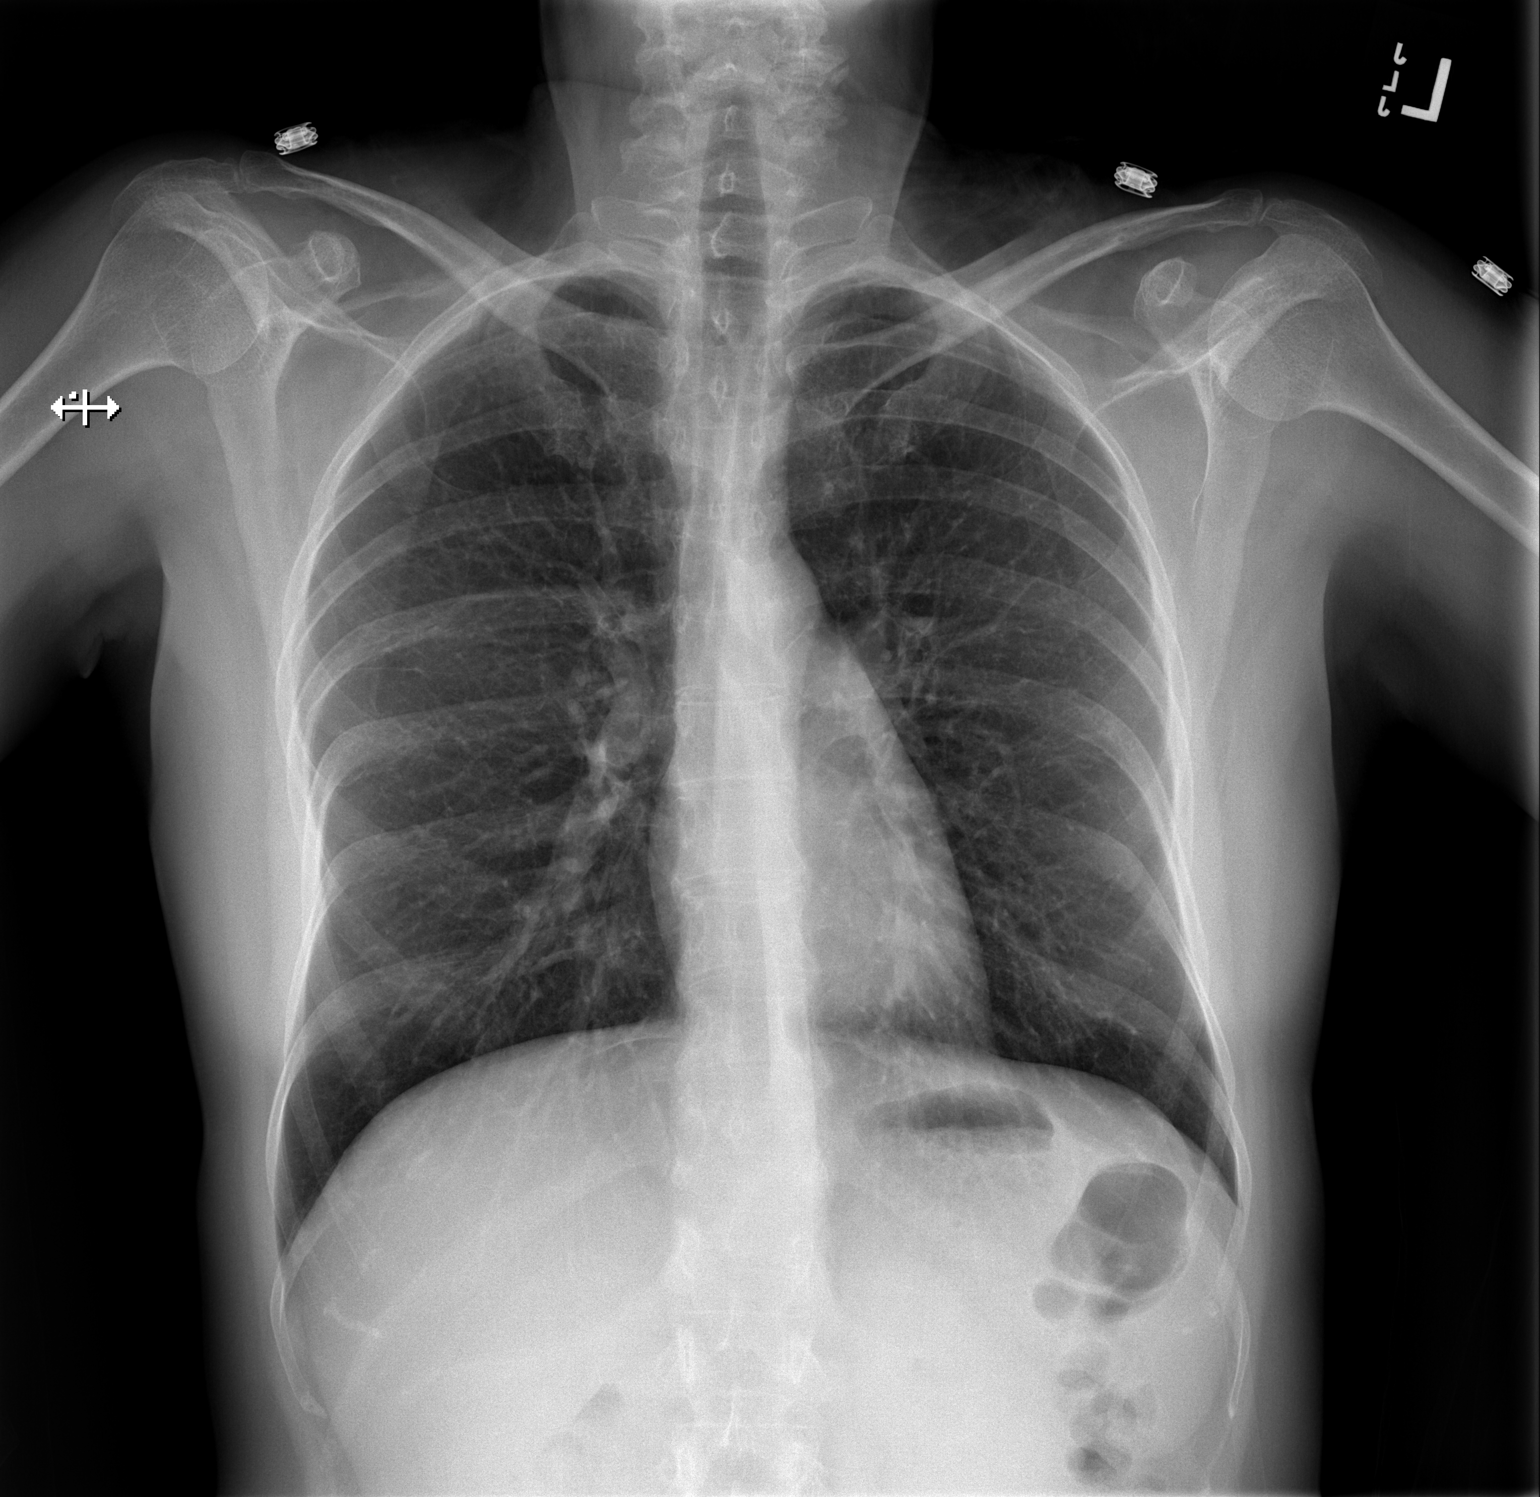

[w chest lat]
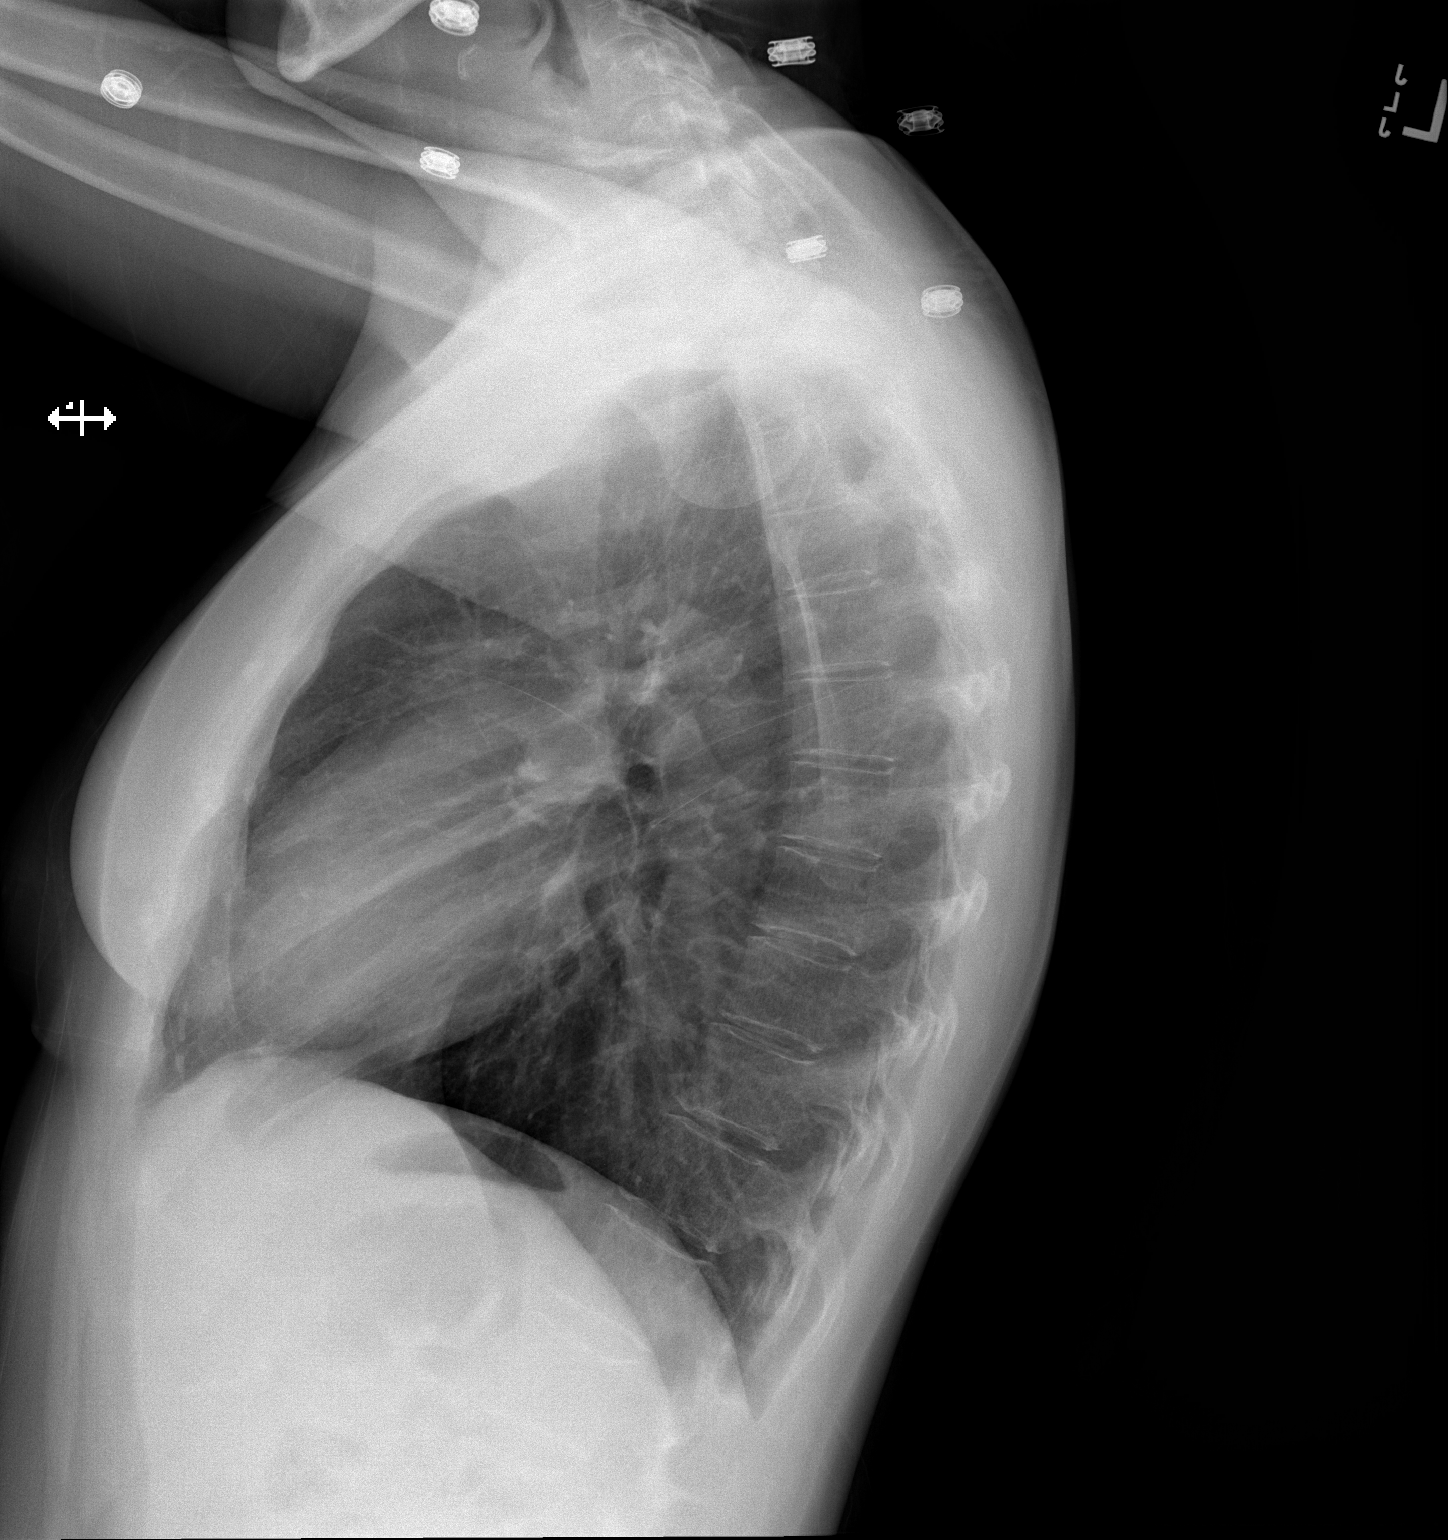

[2 of 2 positions shown; findings below may reference images not displayed]

FINDINGS: The heart size and mediastinal contours are within normal limits.
Both lungs are clear. The visualized skeletal structures are
unremarkable.
IMPRESSION: No active cardiopulmonary disease.

## 2021-02-02 DIAGNOSIS — Z713 Dietary counseling and surveillance: Secondary | ICD-10-CM | POA: Diagnosis not present

## 2021-02-10 DIAGNOSIS — S93145D Subluxation of metatarsophalangeal joint of left lesser toe(s), subsequent encounter: Secondary | ICD-10-CM | POA: Diagnosis not present

## 2021-02-10 DIAGNOSIS — M2011 Hallux valgus (acquired), right foot: Secondary | ICD-10-CM | POA: Diagnosis not present

## 2021-02-10 DIAGNOSIS — S93144D Subluxation of metatarsophalangeal joint of right lesser toe(s), subsequent encounter: Secondary | ICD-10-CM | POA: Diagnosis not present

## 2021-02-10 DIAGNOSIS — G5763 Lesion of plantar nerve, bilateral lower limbs: Secondary | ICD-10-CM | POA: Diagnosis not present

## 2021-02-16 DIAGNOSIS — Z1231 Encounter for screening mammogram for malignant neoplasm of breast: Secondary | ICD-10-CM | POA: Diagnosis not present

## 2021-02-16 DIAGNOSIS — Z6820 Body mass index (BMI) 20.0-20.9, adult: Secondary | ICD-10-CM | POA: Diagnosis not present

## 2021-02-16 DIAGNOSIS — Z01419 Encounter for gynecological examination (general) (routine) without abnormal findings: Secondary | ICD-10-CM | POA: Diagnosis not present

## 2021-02-22 DIAGNOSIS — R2689 Other abnormalities of gait and mobility: Secondary | ICD-10-CM | POA: Diagnosis not present

## 2021-02-22 DIAGNOSIS — S93144D Subluxation of metatarsophalangeal joint of right lesser toe(s), subsequent encounter: Secondary | ICD-10-CM | POA: Diagnosis not present

## 2021-02-22 DIAGNOSIS — S93145D Subluxation of metatarsophalangeal joint of left lesser toe(s), subsequent encounter: Secondary | ICD-10-CM | POA: Diagnosis not present

## 2021-02-22 DIAGNOSIS — M2012 Hallux valgus (acquired), left foot: Secondary | ICD-10-CM | POA: Diagnosis not present

## 2021-03-30 DIAGNOSIS — S82492E Other fracture of shaft of left fibula, subsequent encounter for open fracture type I or II with routine healing: Secondary | ICD-10-CM | POA: Diagnosis not present

## 2021-03-30 DIAGNOSIS — M25511 Pain in right shoulder: Secondary | ICD-10-CM | POA: Diagnosis not present

## 2021-04-12 DIAGNOSIS — M25511 Pain in right shoulder: Secondary | ICD-10-CM | POA: Diagnosis not present

## 2021-04-12 DIAGNOSIS — S82492E Other fracture of shaft of left fibula, subsequent encounter for open fracture type I or II with routine healing: Secondary | ICD-10-CM | POA: Diagnosis not present

## 2021-06-09 DIAGNOSIS — M25511 Pain in right shoulder: Secondary | ICD-10-CM | POA: Diagnosis not present

## 2021-06-09 DIAGNOSIS — S82492E Other fracture of shaft of left fibula, subsequent encounter for open fracture type I or II with routine healing: Secondary | ICD-10-CM | POA: Diagnosis not present

## 2021-07-28 DIAGNOSIS — E785 Hyperlipidemia, unspecified: Secondary | ICD-10-CM | POA: Diagnosis not present

## 2021-07-28 DIAGNOSIS — E559 Vitamin D deficiency, unspecified: Secondary | ICD-10-CM | POA: Diagnosis not present

## 2021-07-28 DIAGNOSIS — Z0000 Encounter for general adult medical examination without abnormal findings: Secondary | ICD-10-CM | POA: Diagnosis not present

## 2021-08-03 DIAGNOSIS — G453 Amaurosis fugax: Secondary | ICD-10-CM | POA: Diagnosis not present

## 2022-04-07 DIAGNOSIS — Z01419 Encounter for gynecological examination (general) (routine) without abnormal findings: Secondary | ICD-10-CM | POA: Diagnosis not present

## 2022-04-07 DIAGNOSIS — Z1231 Encounter for screening mammogram for malignant neoplasm of breast: Secondary | ICD-10-CM | POA: Diagnosis not present

## 2022-04-07 DIAGNOSIS — Z6821 Body mass index (BMI) 21.0-21.9, adult: Secondary | ICD-10-CM | POA: Diagnosis not present

## 2022-04-20 DIAGNOSIS — S82492E Other fracture of shaft of left fibula, subsequent encounter for open fracture type I or II with routine healing: Secondary | ICD-10-CM | POA: Diagnosis not present

## 2022-04-20 DIAGNOSIS — M25511 Pain in right shoulder: Secondary | ICD-10-CM | POA: Diagnosis not present

## 2022-04-28 DIAGNOSIS — M25511 Pain in right shoulder: Secondary | ICD-10-CM | POA: Diagnosis not present

## 2022-04-28 DIAGNOSIS — S82492E Other fracture of shaft of left fibula, subsequent encounter for open fracture type I or II with routine healing: Secondary | ICD-10-CM | POA: Diagnosis not present

## 2022-05-12 DIAGNOSIS — Z86010 Personal history of colonic polyps: Secondary | ICD-10-CM | POA: Diagnosis not present

## 2022-05-16 DIAGNOSIS — S82492E Other fracture of shaft of left fibula, subsequent encounter for open fracture type I or II with routine healing: Secondary | ICD-10-CM | POA: Diagnosis not present

## 2022-05-16 DIAGNOSIS — M25511 Pain in right shoulder: Secondary | ICD-10-CM | POA: Diagnosis not present

## 2022-06-13 DIAGNOSIS — S82492E Other fracture of shaft of left fibula, subsequent encounter for open fracture type I or II with routine healing: Secondary | ICD-10-CM | POA: Diagnosis not present

## 2022-06-13 DIAGNOSIS — M25511 Pain in right shoulder: Secondary | ICD-10-CM | POA: Diagnosis not present

## 2022-06-30 DIAGNOSIS — S82492E Other fracture of shaft of left fibula, subsequent encounter for open fracture type I or II with routine healing: Secondary | ICD-10-CM | POA: Diagnosis not present

## 2022-06-30 DIAGNOSIS — M25511 Pain in right shoulder: Secondary | ICD-10-CM | POA: Diagnosis not present

## 2022-07-14 DIAGNOSIS — M25511 Pain in right shoulder: Secondary | ICD-10-CM | POA: Diagnosis not present

## 2022-07-14 DIAGNOSIS — S82492E Other fracture of shaft of left fibula, subsequent encounter for open fracture type I or II with routine healing: Secondary | ICD-10-CM | POA: Diagnosis not present

## 2022-07-25 DIAGNOSIS — S82492E Other fracture of shaft of left fibula, subsequent encounter for open fracture type I or II with routine healing: Secondary | ICD-10-CM | POA: Diagnosis not present

## 2022-07-25 DIAGNOSIS — M25511 Pain in right shoulder: Secondary | ICD-10-CM | POA: Diagnosis not present

## 2022-07-26 DIAGNOSIS — Z0000 Encounter for general adult medical examination without abnormal findings: Secondary | ICD-10-CM | POA: Diagnosis not present

## 2022-08-15 DIAGNOSIS — S82492E Other fracture of shaft of left fibula, subsequent encounter for open fracture type I or II with routine healing: Secondary | ICD-10-CM | POA: Diagnosis not present

## 2022-08-15 DIAGNOSIS — M25511 Pain in right shoulder: Secondary | ICD-10-CM | POA: Diagnosis not present

## 2022-08-19 DIAGNOSIS — E559 Vitamin D deficiency, unspecified: Secondary | ICD-10-CM | POA: Diagnosis not present

## 2022-08-19 DIAGNOSIS — Z79899 Other long term (current) drug therapy: Secondary | ICD-10-CM | POA: Diagnosis not present

## 2022-08-19 DIAGNOSIS — E785 Hyperlipidemia, unspecified: Secondary | ICD-10-CM | POA: Diagnosis not present

## 2022-08-29 DIAGNOSIS — S82492E Other fracture of shaft of left fibula, subsequent encounter for open fracture type I or II with routine healing: Secondary | ICD-10-CM | POA: Diagnosis not present

## 2022-08-29 DIAGNOSIS — M25511 Pain in right shoulder: Secondary | ICD-10-CM | POA: Diagnosis not present

## 2022-08-31 DIAGNOSIS — M25511 Pain in right shoulder: Secondary | ICD-10-CM | POA: Diagnosis not present

## 2022-08-31 DIAGNOSIS — S82492E Other fracture of shaft of left fibula, subsequent encounter for open fracture type I or II with routine healing: Secondary | ICD-10-CM | POA: Diagnosis not present

## 2022-09-22 DIAGNOSIS — M25511 Pain in right shoulder: Secondary | ICD-10-CM | POA: Diagnosis not present

## 2022-09-22 DIAGNOSIS — S82492E Other fracture of shaft of left fibula, subsequent encounter for open fracture type I or II with routine healing: Secondary | ICD-10-CM | POA: Diagnosis not present

## 2022-10-12 DIAGNOSIS — M25511 Pain in right shoulder: Secondary | ICD-10-CM | POA: Diagnosis not present

## 2022-10-12 DIAGNOSIS — S82492E Other fracture of shaft of left fibula, subsequent encounter for open fracture type I or II with routine healing: Secondary | ICD-10-CM | POA: Diagnosis not present

## 2022-10-31 DIAGNOSIS — S82492E Other fracture of shaft of left fibula, subsequent encounter for open fracture type I or II with routine healing: Secondary | ICD-10-CM | POA: Diagnosis not present

## 2022-10-31 DIAGNOSIS — M25511 Pain in right shoulder: Secondary | ICD-10-CM | POA: Diagnosis not present

## 2022-11-14 DIAGNOSIS — M25511 Pain in right shoulder: Secondary | ICD-10-CM | POA: Diagnosis not present

## 2022-11-14 DIAGNOSIS — S82492E Other fracture of shaft of left fibula, subsequent encounter for open fracture type I or II with routine healing: Secondary | ICD-10-CM | POA: Diagnosis not present

## 2022-11-28 DIAGNOSIS — M25511 Pain in right shoulder: Secondary | ICD-10-CM | POA: Diagnosis not present

## 2022-11-28 DIAGNOSIS — S82492E Other fracture of shaft of left fibula, subsequent encounter for open fracture type I or II with routine healing: Secondary | ICD-10-CM | POA: Diagnosis not present

## 2022-12-05 DIAGNOSIS — Z13820 Encounter for screening for osteoporosis: Secondary | ICD-10-CM | POA: Diagnosis not present

## 2022-12-09 DIAGNOSIS — M25511 Pain in right shoulder: Secondary | ICD-10-CM | POA: Diagnosis not present

## 2022-12-09 DIAGNOSIS — S82492E Other fracture of shaft of left fibula, subsequent encounter for open fracture type I or II with routine healing: Secondary | ICD-10-CM | POA: Diagnosis not present

## 2022-12-22 DIAGNOSIS — M810 Age-related osteoporosis without current pathological fracture: Secondary | ICD-10-CM | POA: Diagnosis not present

## 2022-12-23 DIAGNOSIS — M25512 Pain in left shoulder: Secondary | ICD-10-CM | POA: Diagnosis not present

## 2023-04-19 DIAGNOSIS — M810 Age-related osteoporosis without current pathological fracture: Secondary | ICD-10-CM | POA: Diagnosis not present

## 2023-04-19 DIAGNOSIS — Z01411 Encounter for gynecological examination (general) (routine) with abnormal findings: Secondary | ICD-10-CM | POA: Diagnosis not present

## 2023-04-19 DIAGNOSIS — Z124 Encounter for screening for malignant neoplasm of cervix: Secondary | ICD-10-CM | POA: Diagnosis not present

## 2023-04-19 DIAGNOSIS — Z1231 Encounter for screening mammogram for malignant neoplasm of breast: Secondary | ICD-10-CM | POA: Diagnosis not present

## 2023-04-19 DIAGNOSIS — N952 Postmenopausal atrophic vaginitis: Secondary | ICD-10-CM | POA: Diagnosis not present

## 2023-05-09 DIAGNOSIS — M25512 Pain in left shoulder: Secondary | ICD-10-CM | POA: Diagnosis not present

## 2023-06-19 DIAGNOSIS — M25511 Pain in right shoulder: Secondary | ICD-10-CM | POA: Diagnosis not present

## 2023-06-19 DIAGNOSIS — S82492E Other fracture of shaft of left fibula, subsequent encounter for open fracture type I or II with routine healing: Secondary | ICD-10-CM | POA: Diagnosis not present

## 2023-07-28 DIAGNOSIS — E559 Vitamin D deficiency, unspecified: Secondary | ICD-10-CM | POA: Diagnosis not present

## 2023-07-28 DIAGNOSIS — E785 Hyperlipidemia, unspecified: Secondary | ICD-10-CM | POA: Diagnosis not present

## 2023-07-28 DIAGNOSIS — Z0000 Encounter for general adult medical examination without abnormal findings: Secondary | ICD-10-CM | POA: Diagnosis not present

## 2023-09-06 DIAGNOSIS — M25512 Pain in left shoulder: Secondary | ICD-10-CM | POA: Diagnosis not present

## 2023-10-19 DIAGNOSIS — M25512 Pain in left shoulder: Secondary | ICD-10-CM | POA: Diagnosis not present

## 2023-10-20 DIAGNOSIS — M25512 Pain in left shoulder: Secondary | ICD-10-CM | POA: Diagnosis not present

## 2023-11-09 DIAGNOSIS — M25511 Pain in right shoulder: Secondary | ICD-10-CM | POA: Diagnosis not present

## 2023-11-09 DIAGNOSIS — M25512 Pain in left shoulder: Secondary | ICD-10-CM | POA: Diagnosis not present

## 2023-11-09 DIAGNOSIS — S82492E Other fracture of shaft of left fibula, subsequent encounter for open fracture type I or II with routine healing: Secondary | ICD-10-CM | POA: Diagnosis not present

## 2023-11-13 DIAGNOSIS — M25512 Pain in left shoulder: Secondary | ICD-10-CM | POA: Diagnosis not present

## 2023-11-13 DIAGNOSIS — D225 Melanocytic nevi of trunk: Secondary | ICD-10-CM | POA: Diagnosis not present

## 2023-11-13 DIAGNOSIS — S82492E Other fracture of shaft of left fibula, subsequent encounter for open fracture type I or II with routine healing: Secondary | ICD-10-CM | POA: Diagnosis not present

## 2023-11-13 DIAGNOSIS — D2261 Melanocytic nevi of right upper limb, including shoulder: Secondary | ICD-10-CM | POA: Diagnosis not present

## 2023-11-13 DIAGNOSIS — D2271 Melanocytic nevi of right lower limb, including hip: Secondary | ICD-10-CM | POA: Diagnosis not present

## 2023-11-13 DIAGNOSIS — D485 Neoplasm of uncertain behavior of skin: Secondary | ICD-10-CM | POA: Diagnosis not present

## 2023-11-13 DIAGNOSIS — M25511 Pain in right shoulder: Secondary | ICD-10-CM | POA: Diagnosis not present

## 2023-11-13 DIAGNOSIS — L820 Inflamed seborrheic keratosis: Secondary | ICD-10-CM | POA: Diagnosis not present

## 2023-11-13 DIAGNOSIS — L918 Other hypertrophic disorders of the skin: Secondary | ICD-10-CM | POA: Diagnosis not present

## 2023-11-13 DIAGNOSIS — L821 Other seborrheic keratosis: Secondary | ICD-10-CM | POA: Diagnosis not present

## 2023-11-16 DIAGNOSIS — M25511 Pain in right shoulder: Secondary | ICD-10-CM | POA: Diagnosis not present

## 2023-11-16 DIAGNOSIS — S82492E Other fracture of shaft of left fibula, subsequent encounter for open fracture type I or II with routine healing: Secondary | ICD-10-CM | POA: Diagnosis not present

## 2023-11-16 DIAGNOSIS — M25512 Pain in left shoulder: Secondary | ICD-10-CM | POA: Diagnosis not present

## 2023-11-20 DIAGNOSIS — M25511 Pain in right shoulder: Secondary | ICD-10-CM | POA: Diagnosis not present

## 2023-11-20 DIAGNOSIS — S82492E Other fracture of shaft of left fibula, subsequent encounter for open fracture type I or II with routine healing: Secondary | ICD-10-CM | POA: Diagnosis not present

## 2023-11-20 DIAGNOSIS — M25512 Pain in left shoulder: Secondary | ICD-10-CM | POA: Diagnosis not present

## 2023-11-22 DIAGNOSIS — M25511 Pain in right shoulder: Secondary | ICD-10-CM | POA: Diagnosis not present

## 2023-11-22 DIAGNOSIS — M25512 Pain in left shoulder: Secondary | ICD-10-CM | POA: Diagnosis not present

## 2023-11-22 DIAGNOSIS — S82492E Other fracture of shaft of left fibula, subsequent encounter for open fracture type I or II with routine healing: Secondary | ICD-10-CM | POA: Diagnosis not present

## 2023-11-27 DIAGNOSIS — M25512 Pain in left shoulder: Secondary | ICD-10-CM | POA: Diagnosis not present

## 2023-11-27 DIAGNOSIS — S82492E Other fracture of shaft of left fibula, subsequent encounter for open fracture type I or II with routine healing: Secondary | ICD-10-CM | POA: Diagnosis not present

## 2023-11-27 DIAGNOSIS — M25511 Pain in right shoulder: Secondary | ICD-10-CM | POA: Diagnosis not present

## 2023-11-29 DIAGNOSIS — M25512 Pain in left shoulder: Secondary | ICD-10-CM | POA: Diagnosis not present

## 2023-11-29 DIAGNOSIS — S82492E Other fracture of shaft of left fibula, subsequent encounter for open fracture type I or II with routine healing: Secondary | ICD-10-CM | POA: Diagnosis not present

## 2023-11-29 DIAGNOSIS — M25511 Pain in right shoulder: Secondary | ICD-10-CM | POA: Diagnosis not present

## 2023-12-15 DIAGNOSIS — S82492E Other fracture of shaft of left fibula, subsequent encounter for open fracture type I or II with routine healing: Secondary | ICD-10-CM | POA: Diagnosis not present

## 2023-12-15 DIAGNOSIS — M25511 Pain in right shoulder: Secondary | ICD-10-CM | POA: Diagnosis not present

## 2023-12-15 DIAGNOSIS — M25512 Pain in left shoulder: Secondary | ICD-10-CM | POA: Diagnosis not present

## 2023-12-18 DIAGNOSIS — M25512 Pain in left shoulder: Secondary | ICD-10-CM | POA: Diagnosis not present

## 2023-12-18 DIAGNOSIS — M25511 Pain in right shoulder: Secondary | ICD-10-CM | POA: Diagnosis not present

## 2023-12-18 DIAGNOSIS — S82492E Other fracture of shaft of left fibula, subsequent encounter for open fracture type I or II with routine healing: Secondary | ICD-10-CM | POA: Diagnosis not present

## 2024-02-16 DIAGNOSIS — L905 Scar conditions and fibrosis of skin: Secondary | ICD-10-CM | POA: Diagnosis not present

## 2024-04-30 ENCOUNTER — Other Ambulatory Visit: Payer: Self-pay | Admitting: Obstetrics & Gynecology

## 2024-04-30 DIAGNOSIS — Z1231 Encounter for screening mammogram for malignant neoplasm of breast: Secondary | ICD-10-CM | POA: Diagnosis not present

## 2024-04-30 DIAGNOSIS — Z01411 Encounter for gynecological examination (general) (routine) with abnormal findings: Secondary | ICD-10-CM | POA: Diagnosis not present

## 2024-04-30 DIAGNOSIS — Z1331 Encounter for screening for depression: Secondary | ICD-10-CM | POA: Diagnosis not present

## 2024-04-30 DIAGNOSIS — Z01419 Encounter for gynecological examination (general) (routine) without abnormal findings: Secondary | ICD-10-CM | POA: Diagnosis not present

## 2024-04-30 DIAGNOSIS — E782 Mixed hyperlipidemia: Secondary | ICD-10-CM

## 2024-04-30 DIAGNOSIS — M810 Age-related osteoporosis without current pathological fracture: Secondary | ICD-10-CM | POA: Diagnosis not present

## 2024-05-13 ENCOUNTER — Other Ambulatory Visit: Payer: Self-pay

## 2024-05-20 ENCOUNTER — Inpatient Hospital Stay
Admission: RE | Admit: 2024-05-20 | Discharge: 2024-05-20 | Discharge status: Home / Self Care | Payer: Self-pay | Source: Ambulatory Visit | Attending: Obstetrics & Gynecology | Admitting: Obstetrics & Gynecology

## 2024-05-20 DIAGNOSIS — E782 Mixed hyperlipidemia: Secondary | ICD-10-CM

## 2024-05-23 DIAGNOSIS — M7542 Impingement syndrome of left shoulder: Secondary | ICD-10-CM | POA: Diagnosis not present

## 2024-06-06 DIAGNOSIS — M810 Age-related osteoporosis without current pathological fracture: Secondary | ICD-10-CM | POA: Diagnosis not present

## 2024-06-06 DIAGNOSIS — Z79890 Hormone replacement therapy (postmenopausal): Secondary | ICD-10-CM | POA: Diagnosis not present

## 2024-06-26 DIAGNOSIS — M940 Chondrocostal junction syndrome [Tietze]: Secondary | ICD-10-CM | POA: Diagnosis not present

## 2024-06-26 DIAGNOSIS — E785 Hyperlipidemia, unspecified: Secondary | ICD-10-CM | POA: Diagnosis not present

## 2024-06-26 DIAGNOSIS — M818 Other osteoporosis without current pathological fracture: Secondary | ICD-10-CM | POA: Diagnosis not present

## 2024-08-06 DIAGNOSIS — E559 Vitamin D deficiency, unspecified: Secondary | ICD-10-CM | POA: Diagnosis not present

## 2024-08-06 DIAGNOSIS — Z23 Encounter for immunization: Secondary | ICD-10-CM | POA: Diagnosis not present

## 2024-08-06 DIAGNOSIS — Z0000 Encounter for general adult medical examination without abnormal findings: Secondary | ICD-10-CM | POA: Diagnosis not present

## 2024-08-06 DIAGNOSIS — E785 Hyperlipidemia, unspecified: Secondary | ICD-10-CM | POA: Diagnosis not present

## 2024-09-02 DIAGNOSIS — H43811 Vitreous degeneration, right eye: Secondary | ICD-10-CM | POA: Diagnosis not present
